# Patient Record
Sex: Male | Born: 1970 | Race: White | Hispanic: No | Marital: Married | Smoking: Never smoker
Health system: Southern US, Community
[De-identification: ages and names within clinical notes are randomized; demographics above are authoritative.]

## PROBLEM LIST (undated history)

## (undated) DIAGNOSIS — F319 Bipolar disorder, unspecified: Secondary | ICD-10-CM

## (undated) DIAGNOSIS — E039 Hypothyroidism, unspecified: Secondary | ICD-10-CM

## (undated) HISTORY — PX: TYMPANOSTOMY TUBE PLACEMENT: SHX32

## (undated) HISTORY — DX: Bipolar disorder, unspecified: F31.9

## (undated) HISTORY — PX: VARICOCELE EXCISION: SUR582

## (undated) HISTORY — PX: APPENDECTOMY: SHX54

## (undated) HISTORY — DX: Hypothyroidism, unspecified: E03.9

## (undated) HISTORY — PX: WISDOM TOOTH EXTRACTION: SHX21

---

## 2001-10-03 HISTORY — PX: KNEE RECONSTRUCTION: SHX5883

## 2001-10-08 ENCOUNTER — Emergency Department (HOSPITAL_COMMUNITY): Admission: EM | Admit: 2001-10-08 | Discharge: 2001-10-09 | Payer: Self-pay | Admitting: Emergency Medicine

## 2001-10-09 ENCOUNTER — Encounter: Payer: Self-pay | Admitting: Emergency Medicine

## 2001-10-09 ENCOUNTER — Encounter: Payer: Self-pay | Admitting: Orthopedic Surgery

## 2006-03-19 ENCOUNTER — Ambulatory Visit (HOSPITAL_COMMUNITY): Admission: RE | Admit: 2006-03-19 | Discharge: 2006-03-19 | Payer: Self-pay | Admitting: Family Medicine

## 2006-03-19 ENCOUNTER — Inpatient Hospital Stay (HOSPITAL_COMMUNITY): Admission: EM | Admit: 2006-03-19 | Discharge: 2006-03-20 | Payer: Self-pay | Admitting: Emergency Medicine

## 2015-09-18 ENCOUNTER — Encounter: Payer: Self-pay | Admitting: Family Medicine

## 2015-09-18 ENCOUNTER — Ambulatory Visit (INDEPENDENT_AMBULATORY_CARE_PROVIDER_SITE_OTHER): Payer: BLUE CROSS/BLUE SHIELD | Admitting: Family Medicine

## 2015-09-18 VITALS — BP 126/77 | HR 49 | Ht 76.0 in | Wt 200.0 lb

## 2015-09-18 DIAGNOSIS — M7711 Lateral epicondylitis, right elbow: Secondary | ICD-10-CM | POA: Diagnosis not present

## 2015-09-18 MED ORDER — NITROGLYCERIN 0.2 MG/HR TD PT24: MEDICATED_PATCH | TRANSDERMAL | Status: DC

## 2015-09-18 NOTE — Patient Instructions (Signed)
You have lateral epicondylitis Ice the area 3-4 times a day for 15 minutes at a time. Tylenol or aleve as needed for pain. Counterforce brace (or sleeve) as directed can help unload area - wear this regularly if it provides you with relief. Hammer rotation exercise, wrist extension exercise with 1 pound weight - 3 sets of 10 once a day.   Stretching - hold for 20-30 seconds and repeat 3 times. Start physical therapy and do home exercises on days you don't go to therapy. Nitro patches 1/4th patch over affected area, change daily. If you're doing well you could in a week start doing the same thing to your left elbow. Consider injection if not improving. Follow up in 6 weeks.

## 2015-09-21 ENCOUNTER — Ambulatory Visit: Payer: Self-pay | Admitting: Family Medicine

## 2015-09-21 DIAGNOSIS — M7711 Lateral epicondylitis, right elbow: Secondary | ICD-10-CM | POA: Insufficient documentation

## 2015-09-21 NOTE — Assessment & Plan Note (Signed)
Shown home exercises to do daily.  Start physical therapy.  Counterforce brace, icing, tylenol/nsaids.  Nitro patches (discussed risks of skin irritation, headache).  Consider injection if not improving.  F/u in 6 weeks for reevaluation.

## 2015-09-21 NOTE — Addendum Note (Signed)
Addended by: Kathi SimpersWISE, Fintan Grater F on: 09/21/2015 01:34 PM   Modules accepted: Orders

## 2015-09-21 NOTE — Progress Notes (Signed)
PCP: No primary care provider on file.  Subjective:   HPI: Patient is a 44 y.o. male here for right elbow pain.  Patient reports about 2 years ago he had similar problems with right elbow - improved after an injection. Then a month ago pain recurred - plays tennis twice a week and worsened with this. Has tried a counterforce brace, rest. Worse picking up items with this hand lateral elbow. Right handed. Pain level 0/10 at rest, up to 7/10 and sharp with wrist motions. No skin changes, fever, other complaints.  No past medical history on file.  No current outpatient prescriptions on file prior to visit.   No current facility-administered medications on file prior to visit.    No past surgical history on file.  No Known Allergies  Social History   Social History  . Marital Status: Married    Spouse Name: N/A  . Number of Children: N/A  . Years of Education: N/A   Occupational History  . Not on file.   Social History Main Topics  . Smoking status: Never Smoker   . Smokeless tobacco: Not on file  . Alcohol Use: Not on file  . Drug Use: Not on file  . Sexual Activity: Not on file   Other Topics Concern  . Not on file   Social History Narrative  . No narrative on file    No family history on file.  BP 126/77 mmHg  Pulse 49  Ht 6\' 4"  (1.93 m)  Wt 200 lb (90.719 kg)  BMI 24.35 kg/m2  Review of Systems: See HPI above.    Objective:  Physical Exam:  Gen: NAD, comfortable in exam room.  Right elbow: No gross deformity, swelling, bruising. TTP lateral epicondyle and just distal to this.  No other tenderness. FROM elbow without pain.  Pain on resisted wrist and 3rd digit extension. Collateral ligaments intact. NVI distally.  Left elbow: FROM without pain.    Assessment & Plan:  1. Right lateral epicondylitis -  Shown home exercises to do daily.  Start physical therapy.  Counterforce brace, icing, tylenol/nsaids.  Nitro patches (discussed risks of skin  irritation, headache).  Consider injection if not improving.  F/u in 6 weeks for reevaluation.

## 2015-09-22 ENCOUNTER — Telehealth: Payer: Self-pay | Admitting: Family Medicine

## 2015-10-07 ENCOUNTER — Telehealth: Payer: Self-pay | Admitting: Family Medicine

## 2015-10-07 NOTE — Telephone Encounter (Signed)
Typically we need more information from physical therapy depending on how they want to do this and how frequently.  I would need to know the concentration and amount of dexamethasone to make sure I send in enough.

## 2015-10-30 ENCOUNTER — Encounter: Payer: Self-pay | Admitting: Family Medicine

## 2015-10-30 ENCOUNTER — Ambulatory Visit (INDEPENDENT_AMBULATORY_CARE_PROVIDER_SITE_OTHER): Payer: BLUE CROSS/BLUE SHIELD | Admitting: Family Medicine

## 2015-10-30 VITALS — BP 123/72 | HR 86 | Ht 76.0 in | Wt 200.0 lb

## 2015-10-30 DIAGNOSIS — M7711 Lateral epicondylitis, right elbow: Secondary | ICD-10-CM

## 2015-10-30 DIAGNOSIS — M25522 Pain in left elbow: Secondary | ICD-10-CM | POA: Diagnosis not present

## 2015-10-30 NOTE — Patient Instructions (Signed)
Consider using patch 12 hours on right, 12 hours on left. We will add physical therapy for left side as well. Call me if you want to try an injection. Follow up with me in 6 weeks otherwise.

## 2015-11-03 NOTE — Progress Notes (Signed)
PCP: No primary care provider on file.  Subjective:   HPI: Patient is a 45 y.o. male here for right elbow pain.  09/18/15: Patient reports about 2 years ago he had similar problems with right elbow - improved after an injection. Then a month ago pain recurred - plays tennis twice a week and worsened with this. Has tried a counterforce brace, rest. Worse picking up items with this hand lateral elbow. Right handed. Pain level 0/10 at rest, up to 7/10 and sharp with wrist motions. No skin changes, fever, other complaints.  10/30/15: Patient reports he has had some progress since last visit. Pain level 0/10 at rest, up to 5/10 with movement of wrist. Pain is sharp. Nitro caused small headaches but tolerating. Doing physical therapy twice a week. No skin changes, fever, other complaints. Some pain left elbow now in same area.  No past medical history on file.  Current Outpatient Prescriptions on File Prior to Visit  Medication Sig Dispense Refill  . lamoTRIgine (LAMICTAL) 200 MG tablet TK 1 T PO QD  5  . nitroGLYCERIN (NITRODUR - DOSED IN MG/24 HR) 0.2 mg/hr patch Apply 1/4th patch to affected elbow, change daily 30 patch 1   No current facility-administered medications on file prior to visit.    No past surgical history on file.  No Known Allergies  Social History   Social History  . Marital Status: Married    Spouse Name: N/A  . Number of Children: N/A  . Years of Education: N/A   Occupational History  . Not on file.   Social History Main Topics  . Smoking status: Never Smoker   . Smokeless tobacco: Not on file  . Alcohol Use: Not on file  . Drug Use: Not on file  . Sexual Activity: Not on file   Other Topics Concern  . Not on file   Social History Narrative    No family history on file.  BP 123/72 mmHg  Pulse 86  Ht  (1.93 m)  Wt 200 lb (90.719 kg)  BMI 24.35 kg/m2  Review of Systems: See HPI above.    Objective:  Physical Exam:  Gen:  NAD, comfortable in exam room.  Right elbow: No gross deformity, swelling, bruising. TTP lateral epicondyle and just distal to this.  No other tenderness. FROM elbow without pain.  Pain on resisted wrist and 3rd digit extension but improved. Collateral ligaments intact. NVI distally.  Left elbow: FROM. Mild TTP lateral epicondyle.  No pain resisted wrist extension, 3rd digit extension.    Assessment & Plan:  1. Right lateral epicondylitis -  Continue physical therapy, home exercises.   Some slow progress since last visit.  Some pain on left now though too - alternate use of patch on each side.  Consider injection if still not improving.  F/u in 6 weeks.

## 2015-11-03 NOTE — Assessment & Plan Note (Signed)
Continue physical therapy, home exercises.   Some slow progress since last visit.  Some pain on left now though too - alternate use of patch on each side.  Consider injection if still not improving.  F/u in 6 weeks.

## 2015-11-12 NOTE — Telephone Encounter (Signed)
Finished

## 2018-05-24 ENCOUNTER — Other Ambulatory Visit: Payer: Self-pay | Admitting: Orthopedic Surgery

## 2018-05-24 DIAGNOSIS — M25511 Pain in right shoulder: Secondary | ICD-10-CM

## 2018-06-07 ENCOUNTER — Other Ambulatory Visit: Payer: BLUE CROSS/BLUE SHIELD

## 2018-07-13 ENCOUNTER — Encounter: Payer: Self-pay | Admitting: Emergency Medicine

## 2018-07-26 ENCOUNTER — Ambulatory Visit: Payer: Self-pay | Admitting: Psychiatry

## 2018-08-21 ENCOUNTER — Ambulatory Visit: Payer: Self-pay | Admitting: Psychiatry

## 2018-09-11 ENCOUNTER — Encounter: Payer: Self-pay | Admitting: Psychiatry

## 2018-09-11 ENCOUNTER — Ambulatory Visit (INDEPENDENT_AMBULATORY_CARE_PROVIDER_SITE_OTHER): Payer: BLUE CROSS/BLUE SHIELD | Admitting: Psychiatry

## 2018-09-11 DIAGNOSIS — Z79899 Other long term (current) drug therapy: Secondary | ICD-10-CM | POA: Diagnosis not present

## 2018-09-11 DIAGNOSIS — F319 Bipolar disorder, unspecified: Secondary | ICD-10-CM

## 2018-09-11 NOTE — Progress Notes (Signed)
Julian [name] [id number] 01-27-1971 47 y.o.  Subjective:   Patient ID:  Julian [name] is a 47 y.o. (DOB 01-27-1971) male.  Chief Complaint:  Chief Complaint  Patient presents with  . Follow-up    Mdication Managment    HPI [name] presents to the office today for follow-up of adding lithium.  Immediate profound positive effects and then seemed to lose it.  Wonders if dehydration may have played a role.  Now in a more stable positive place today. More even keel and stable than ever.  Can get a little blue but nothing unusual.  High side is capped..  Slight improvement cognitively.  Wonders about cutting the lamotrigine bc benefit from the lithium.   Review of Systems:  Review of Systems  Neurological: Negative for tremors and weakness.  Psychiatric/Behavioral: Negative for agitation, behavioral problems, confusion, decreased concentration, dysphoric mood, hallucinations, self-injury, sleep disturbance and suicidal ideas. The patient is not nervous/anxious and is not hyperactive.     Medications: I have reviewed the patient's current medications.  Current Outpatient Medications  Medication Sig Dispense Refill  . lamoTRIgine (LAMICTAL) 200 MG tablet Take 200 mg by mouth daily.   5  . lithium carbonate 300 MG capsule Take 600 mg by mouth daily.    . traZODone (DESYREL) 50 MG tablet Take 50 mg by mouth at bedtime.    [name]. zolpidem (AMBIEN) 10 MG tablet Take 10 mg by mouth at bedtime as needed for sleep. 1/2 QHS PRN insomnia    . nitroGLYCERIN (NITRODUR - DOSED IN MG/24 HR) 0.2 mg/hr patch Apply 1/4th patch to affected elbow, change daily (Patient not taking: Reported on 09/11/2018) 30 patch 1  . polyethylene glycol-electrolytes (NULYTELY/GOLYTELY) 420 g solution TK UTD  0   No current facility-administered medications for this visit.     Medication Side Effects: Other: slight dizziness  Allergies: No Known Allergies  History reviewed. No pertinent past medical history.  History  reviewed. No pertinent family history.  Social History   Socioeconomic History  . Marital status: Married    Spouse name: Not on file  . Number of children: Not on file  . Years of education: Not on file  . Highest education level: Not on file  Occupational History  . Not on file  Social Needs  . Financial resource strain: Not on file  . Food insecurity:    Worry: Not on file    Inability: Not on file  . Transportation needs:    Medical: Not on file    Non-medical: Not on file  Tobacco Use  . Smoking status: Never Smoker  . Smokeless tobacco: Never Used  Substance and Sexual Activity  . Alcohol use: Not on file  . Drug use: Not on file  . Sexual activity: Not on file  Lifestyle  . Physical activity:    Days per week: Not on file    Minutes per session: Not on file  . Stress: Not on file  Relationships  . Social connections:    Talks on phone: Not on file    Gets together: Not on file    Attends religious service: Not on file    Active member of club or organization: Not on file    Attends meetings of clubs or organizations: Not on file    Relationship status: Not on file  . Intimate partner violence:    Fear of current or ex partner: Not on file    Emotionally abused: Not on file  Physically abused: Not on file    Forced sexual activity: Not on file  Other Topics Concern  . Not on file  Social History Narrative  . Not on file    Past Medical History, Surgical history, Social history, and Family history were reviewed and updated as appropriate.   Please see review of systems for further details on the patient's review from today.   Objective:   Physical Exam:  There were no vitals taken for this visit.  Physical Exam  Constitutional: He is oriented to person, place, and time. He appears well-developed. No distress.  Musculoskeletal: He exhibits no deformity.  Neurological: He is alert and oriented to person, place, and time. He displays no tremor.  Coordination and gait normal.  Psychiatric: He has a normal mood and affect. His speech is normal and behavior is normal. Judgment and thought content normal. His mood appears not anxious. His affect is not angry, not blunt, not labile and not inappropriate. Cognition and memory are normal. He does not exhibit a depressed mood. He expresses no homicidal and no suicidal ideation. He expresses no suicidal plans and no homicidal plans.  Insight intact. No auditory or visual hallucinations. No delusions.  He is attentive.    Lab Review:  No results found for: NA, K, CL, CO2, GLUCOSE, BUN, CREATININE, CALCIUM, PROT, ALBUMIN, AST, ALT, ALKPHOS, BILITOT, GFRNONAA, GFRAA  No results found for: WBC, RBC, HGB, HCT, PLT, MCV, MCH, MCHC, RDW, LYMPHSABS, MONOABS, EOSABS, BASOSABS  No results found for: POCLITH, LITHIUM   No results found for: PHENYTOIN, PHENOBARB, VALPROATE, CBMZ   .res Assessment: Plan:    Bipolar I disorder (HCC) - Plan: Lithium level  Lithium use - Plan: Lithium level, Basic metabolic panel, TSH   FU 10 weeks and if doing well we will reduce the lamotrigine to 150 mg a day.  He wants to discover whether he can be a lithium mono responder.  He is aware there is risks and reducing the lamotrigine but wonders if lamotrigine is causing some mild cognitive dysfunction.  He is also aware that he is likely had a low-dose of lithium and will have a likely low blood level but agrees to checking labs to verify this information.  Because of the brief period of time in which he seemed to have more side effects from lithium around heavy exertion we will not use the dosage of lithium at this time.  Counseled patient regarding potential benefits, risks, and side effects of lithium to include potential risk of lithium affecting thyroid and renal function.  Discussed need for periodic lab monitoring to determine drug level and to assess for potential adverse effects.  Counseled patient regarding  signs and symptoms of lithium toxicity and advised that they notify office immediately or seek urgent medical attention if experiencing these signs and symptoms.  Patient advised to contact office with any questions or concerns.  Monitor for any sign of rash. Please stop taking Lamictal and contact office immediately rash develops. Recommend seeking urgent medical attention if rash is severe and/or spreading quickly.  He has questions around the value of the other supplements.  I recommended that he not discontinue anything except potentially the fish oil at this point because we want to maximize this because it cognitive abilities.  Meredith Staggers, MD, DFAPA  Please see After Visit Summary for patient specific instructions.  Future Appointments  Date Time Provider Department Center  11/20/2018  9:00 AM Cottle, Steva Ready., MD [facility] None  Orders Placed This Encounter  Procedures  . Lithium level  . Basic metabolic panel  . TSH      -------------------------------

## 2018-09-20 ENCOUNTER — Other Ambulatory Visit: Payer: Self-pay

## 2018-09-20 MED ORDER — LITHIUM CARBONATE 300 MG PO CAPS: 600.0000 mg | ORAL_CAPSULE | Freq: Every day | ORAL | 1 refills | Status: DC

## 2018-09-21 ENCOUNTER — Telehealth: Payer: Self-pay

## 2018-09-21 LAB — BASIC METABOLIC PANEL
BUN: 20 mg/dL (ref 7–25)
CO2: 29 mmol/L (ref 20–32)
Calcium: 10 mg/dL (ref 8.6–10.3)
Chloride: 104 mmol/L (ref 98–110)
Creat: 1.29 mg/dL (ref 0.60–1.35)
Glucose, Bld: 107 mg/dL — ABNORMAL HIGH (ref 65–99)
Potassium: 5 mmol/L (ref 3.5–5.3)
Sodium: 141 mmol/L (ref 135–146)

## 2018-09-21 LAB — TSH: TSH: 3.83 mIU/L (ref 0.40–4.50)

## 2018-09-21 LAB — LITHIUM LEVEL: Lithium Lvl: 0.4 mmol/L — ABNORMAL LOW (ref 0.6–1.2)

## 2018-09-21 NOTE — Telephone Encounter (Signed)
-----   Message from Lauraine Rinnearey G Cottle Jr., MD sent at 09/21/2018  4:05 PM EST ----- On lithium 600 mg at night and is tolerating it well.  His lithium level is low normal as expected.  He is electrolytes and kidney function test and thyroid test are normal.  There is no reason to change any medication based on these tests.  These inform him.

## 2018-11-09 ENCOUNTER — Other Ambulatory Visit: Payer: Self-pay

## 2018-11-09 MED ORDER — LAMOTRIGINE 200 MG PO TABS: 200.0000 mg | ORAL_TABLET | Freq: Every day | ORAL | 0 refills | Status: DC

## 2018-11-20 ENCOUNTER — Ambulatory Visit (INDEPENDENT_AMBULATORY_CARE_PROVIDER_SITE_OTHER): Payer: BLUE CROSS/BLUE SHIELD | Admitting: Psychiatry

## 2018-11-20 ENCOUNTER — Encounter: Payer: Self-pay | Admitting: Psychiatry

## 2018-11-20 DIAGNOSIS — Z79899 Other long term (current) drug therapy: Secondary | ICD-10-CM | POA: Diagnosis not present

## 2018-11-20 DIAGNOSIS — F319 Bipolar disorder, unspecified: Secondary | ICD-10-CM

## 2018-11-20 MED ORDER — LAMOTRIGINE 150 MG PO TABS: 150.0000 mg | ORAL_TABLET | Freq: Every day | ORAL | 0 refills | Status: DC

## 2018-11-20 NOTE — Progress Notes (Signed)
Julian [name] [id number] 1971-07-02 48 y.o.  Subjective:   Patient ID:  Julian [name] is a 48 y.o. (DOB [birth year]) male.  Chief Complaint:  Chief Complaint  Patient presents with  . Follow-up    Medication management   Last seen December 10 HPI Zakhai Klebe presents to the office today for follow-up of adding lithium.   At last visit we considered but did not reduced the lamotrigine to 150 with the goal of determining if he can be a lithium mono responder  Still believes the lithium is a wonder drug.  Never been more stable.  Patient reports stable mood and denies depressed or irritable moods.  Patient denies any recent difficulty with anxiety.  Positive stress of new job and gets up early to go work out.  Light sleeper.  Patient denies difficulty with sleep initiation or maintenance, but works hard to get enough sleep.  Denies appetite disturbance.  Patient reports that energy and motivation have been good.  Patient denies any difficulty with concentration.  Patient denies any suicidal ideation. Lately working hard and more tired the last few days.  Immediate profound positive effects and then seemed to lose it.  Wonders if dehydration may have played a role.  Now in a more stable positive place today. More even keel and stable than ever.  Can get a little blue but nothing unusual.  High side is capped..  Slight improvement cognitively.  Wonders about cutting the lamotrigine bc benefit from the lithium.   Review of Systems:  Review of Systems  Neurological: Negative for tremors and weakness.  Psychiatric/Behavioral: Negative for agitation, behavioral problems, confusion, decreased concentration, dysphoric mood, hallucinations, self-injury, sleep disturbance and suicidal ideas. The patient is not nervous/anxious and is not hyperactive.     Medications: I have reviewed the patient's current medications.  Current Outpatient Medications  Medication Sig Dispense Refill  . Acetylcysteine 600  MG CAPS Take 600 mg by mouth daily.    [name] lamoTRIgine (LAMICTAL) 200 MG tablet Take 1 tablet (200 mg total) by mouth daily. 90 tablet 0  . lithium carbonate 300 MG capsule Take 2 capsules (600 mg total) by mouth daily. 180 capsule 1  . traZODone (DESYREL) 50 MG tablet Take 50 mg by mouth at bedtime.    [name] zolpidem (AMBIEN) 10 MG tablet Take 10 mg by mouth at bedtime as needed for sleep. 1/2 QHS PRN insomnia    . lamoTRIgine (LAMICTAL) 150 MG tablet Take 1 tablet (150 mg total) by mouth daily. 90 tablet 0  . nitroGLYCERIN (NITRODUR - DOSED IN MG/24 HR) 0.2 mg/hr patch Apply 1/4th patch to affected elbow, change daily (Patient not taking: Reported on 09/11/2018) 30 patch 1  . polyethylene glycol-electrolytes (NULYTELY/GOLYTELY) 420 g solution TK UTD  0   No current facility-administered medications for this visit.     Medication Side Effects: dry mouth.  Allergies: No Known Allergies  History reviewed. No pertinent past medical history.  History reviewed. No pertinent family history.  Social History   Socioeconomic History  . Marital status: Married    Spouse name: Not on file  . Number of children: Not on file  . Years of education: Not on file  . Highest education level: Not on file  Occupational History  . Not on file  Social Needs  . Financial resource strain: Not on file  . Food insecurity:    Worry: Not on file    Inability: Not on file  . Transportation needs:    Medical:  Not on file    Non-medical: Not on file  Tobacco Use  . Smoking status: Never Smoker  . Smokeless tobacco: Never Used  Substance and Sexual Activity  . Alcohol use: Not on file  . Drug use: Not on file  . Sexual activity: Not on file  Lifestyle  . Physical activity:    Days per week: Not on file    Minutes per session: Not on file  . Stress: Not on file  Relationships  . Social connections:    Talks on phone: Not on file    Gets together: Not on file    Attends religious service: Not on file     Active member of club or organization: Not on file    Attends meetings of clubs or organizations: Not on file    Relationship status: Not on file  . Intimate partner violence:    Fear of current or ex partner: Not on file    Emotionally abused: Not on file    Physically abused: Not on file    Forced sexual activity: Not on file  Other Topics Concern  . Not on file  Social History Narrative  . Not on file    Past Medical History, Surgical history, Social history, and Family history were reviewed and updated as appropriate.   Please see review of systems for further details on the patient's review from today.   Objective:   Physical Exam:  There were no vitals taken for this visit.  Physical Exam Constitutional:      General: He is not in acute distress.    Appearance: He is well-developed.  Musculoskeletal:        General: No deformity.  Neurological:     Mental Status: He is alert and oriented to person, place, and time.     Motor: No tremor.     Coordination: Coordination normal.     Gait: Gait normal.  Psychiatric:        Attention and Perception: He is attentive.        Mood and Affect: Mood is not anxious or depressed. Affect is not labile, blunt, angry or inappropriate.        Speech: Speech normal.        Behavior: Behavior normal.        Thought Content: Thought content normal. Thought content does not include homicidal or suicidal ideation. Thought content does not include homicidal or suicidal plan.        Judgment: Judgment normal.     Comments: Insight intact. No auditory or visual hallucinations. No delusions.      Lab Review:     Component Value Date/Time   NA 141 09/20/2018 0825   K 5.0 09/20/2018 0825   CL 104 09/20/2018 0825   CO2 29 09/20/2018 0825   GLUCOSE 107 (H) 09/20/2018 0825   BUN 20 09/20/2018 0825   CREATININE 1.29 09/20/2018 0825   CALCIUM 10.0 09/20/2018 0825    No results found for: WBC, RBC, HGB, HCT, PLT, MCV, MCH, MCHC,  RDW, LYMPHSABS, MONOABS, EOSABS, BASOSABS  Lithium Lvl  Date Value Ref Range Status  09/20/2018 0.4 (L) 0.6 - 1.2 mmol/L Final     No results found for: PHENYTOIN, PHENOBARB, VALPROATE, CBMZ   .res Assessment: Plan:    Bipolar I disorder (HCC)  Lithium use   Disc the low level of lithium and possible need to increase the dosage.  Disc the dosage range.   well we will reduce the  lamotrigine to 150 mg a day.  He wants to discover whether he can be a lithium mono responder.  He also wants to see if he feels a little cognitively sharper.He is aware there is risks and reducing the lamotrigine but wonders if lamotrigine is causing some mild cognitive dysfunction.  Reduce lamotrigine 150 mg daily for 2 months In mid April if you are feeling well call and we will will reduce again to 100 mg daily  Counseled patient regarding potential benefits, risks, and side effects of lithium to include potential risk of lithium affecting thyroid and renal function.  Discussed need for periodic lab monitoring to determine drug level and to assess for potential adverse effects.  Counseled patient regarding signs and symptoms of lithium toxicity and advised that they notify office immediately or seek urgent medical attention if experiencing these signs and symptoms.  Patient advised to contact office with any questions or concerns.  Monitor for any sign of rash. Please stop taking Lamictal and contact office immediately rash develops. Recommend seeking urgent medical attention if rash is severe and/or spreading quickly.  He has questions around the value of the other supplements.  I recommended that he not discontinue anything except potentially the fish oil at this point because we want to maximize this because it cognitive abilities. And continue the NAC.  Call if there is any worsening of depression or increased mood instability.  We may have to increase the lithium because of a low blood level of 0.4 he is  aware that it is below the usual therapeutic dosage range.  FU 4 mos  Meredith Staggers, MD, DFAPA  Please see After Visit Summary for patient specific instructions.  No future appointments.  No orders of the defined types were placed in this encounter.     -------------------------------

## 2018-11-20 NOTE — Patient Instructions (Signed)
Reduce lamotrigine 150 mg daily for 2 months In mid April if you are feeling well call and we will will reduce again to 100 mg daily

## 2018-11-29 ENCOUNTER — Other Ambulatory Visit: Payer: Self-pay | Admitting: Psychiatry

## 2019-02-13 ENCOUNTER — Other Ambulatory Visit: Payer: Self-pay | Admitting: Psychiatry

## 2019-03-13 ENCOUNTER — Other Ambulatory Visit: Payer: Self-pay

## 2019-03-13 ENCOUNTER — Encounter: Payer: Self-pay | Admitting: Psychiatry

## 2019-03-13 ENCOUNTER — Ambulatory Visit (INDEPENDENT_AMBULATORY_CARE_PROVIDER_SITE_OTHER): Payer: BC Managed Care – PPO | Admitting: Psychiatry

## 2019-03-13 DIAGNOSIS — F319 Bipolar disorder, unspecified: Secondary | ICD-10-CM

## 2019-03-13 DIAGNOSIS — Z79899 Other long term (current) drug therapy: Secondary | ICD-10-CM

## 2019-03-13 MED ORDER — LITHIUM CARBONATE 150 MG PO CAPS: 150.0000 mg | ORAL_CAPSULE | Freq: Three times a day (TID) | ORAL | 0 refills | Status: DC

## 2019-03-13 NOTE — Progress Notes (Signed)
Julian [name] [id number] 01/30/1971 48 y.o.  Subjective:   Patient ID:  Julian [name] is a 48 y.o. (DOB April 10, 1971) male.  Chief Complaint:  Chief Complaint  Patient presents with  . Follow-up    Medication Management    HPI Julian [name] presents to the office today for follow-up of adding lithium and reducing lamotrigine from 200 mg to 150 mg daily..   Last visit in February and lamotrigine was decreased with the goal of determining if Julian [name] can be a lithium mono responder  Has reduced lamotrigine to 150 mg without problems now.  Mood is stable.  Better cognition with reduction.  Transition was hard with reduction.  Felt sort of weak and uncomfortable.  Julian [name] feels it whenever meds are changed and has SE initially.  Worries about increasing the lithium as we discussed last time.  Still believes the lithium is a wonder drug.  Very stable.  Patient reports stable mood and denies depressed or irritable moods.  Patient denies any recent difficulty with anxiety.  Positive stress of new job and gets up early to go work out.  Light sleeper.  Patient denies difficulty with sleep initiation or maintenance, but works hard to get enough sleep.  Denies appetite disturbance.  Patient reports that energy and motivation have been good.  Patient denies any difficulty with concentration.  Patient denies any suicidal ideation. Lately working hard and more tired the last few days.  Exercise helps. Works from home.  Past Psychiatric Medication Trials: Lithium, fluvoxamine, risperidone, lamotrigine, Ambien, trazodone  2 prior significant manic episodes .Review of Systems:  Review of Systems  Neurological: Negative for tremors and weakness.  Psychiatric/Behavioral: Negative for agitation, behavioral problems, confusion, decreased concentration, dysphoric mood, hallucinations, self-injury, sleep disturbance and suicidal ideas. The patient is not nervous/anxious and is not hyperactive.     Medications: I have  reviewed the patient's current medications.  Current Outpatient Medications  Medication Sig Dispense Refill  . Acetylcysteine 600 MG CAPS Take 600 mg by mouth daily.    [name] lamoTRIgine (LAMICTAL) 150 MG tablet TAKE 1 TABLET(150 MG) BY MOUTH DAILY 90 tablet 0  . lithium carbonate 300 MG capsule Take 2 capsules (600 mg total) by mouth daily. 180 capsule 1  . traZODone (DESYREL) 50 MG tablet TAKE 1 TABLET BY MOUTH EVERY NIGHT AT BEDTIME AS NEEDED FOR INSOMNIA 30 tablet 5  . zolpidem (AMBIEN) 10 MG tablet Take 10 mg by mouth at bedtime as needed for sleep. 1/2 QHS PRN insomnia    . lithium carbonate 150 MG capsule Take 1 capsule (150 mg total) by mouth 3 (three) times daily with meals. 90 capsule 0   No current facility-administered medications for this visit.     Medication Side Effects: dry mouth.  Allergies: No Known Allergies  History reviewed. No pertinent past medical history.  History reviewed. No pertinent family history.  Social History   Socioeconomic History  . Marital status: Married    Spouse name: Not on file  . Number of children: Not on file  . Years of education: Not on file  . Highest education level: Not on file  Occupational History  . Not on file  Social Needs  . Financial resource strain: Not on file  . Food insecurity:    Worry: Not on file    Inability: Not on file  . Transportation needs:    Medical: Not on file    Non-medical: Not on file  Tobacco Use  . Smoking status: Never Smoker  .  Smokeless tobacco: Never Used  Substance and Sexual Activity  . Alcohol use: Not on file  . Drug use: Not on file  . Sexual activity: Not on file  Lifestyle  . Physical activity:    Days per week: Not on file    Minutes per session: Not on file  . Stress: Not on file  Relationships  . Social connections:    Talks on phone: Not on file    Gets together: Not on file    Attends religious service: Not on file    Active member of club or organization: Not on file     Attends meetings of clubs or organizations: Not on file    Relationship status: Not on file  . Intimate partner violence:    Fear of current or ex partner: Not on file    Emotionally abused: Not on file    Physically abused: Not on file    Forced sexual activity: Not on file  Other Topics Concern  . Not on file  Social History Narrative  . Not on file    Past Medical History, Surgical history, Social history, and Family history were reviewed and updated as appropriate.   Please see review of systems for further details on the patient's review from today.   Objective:   Physical Exam:  There were no vitals taken for this visit.  Physical Exam Constitutional:      General: Julian [name] is not in acute distress.    Appearance: Julian [name] is well-developed.  Musculoskeletal:        General: No deformity.  Neurological:     Mental Status: Julian [name] is alert and oriented to person, place, and time.     Motor: No tremor.     Coordination: Coordination normal.     Gait: Gait normal.  Psychiatric:        Attention and Perception: Julian [name] is attentive.        Mood and Affect: Mood is not anxious or depressed. Affect is not labile, blunt, angry or inappropriate.        Speech: Speech normal.        Behavior: Behavior normal.        Thought Content: Thought content normal. Thought content does not include homicidal or suicidal ideation. Thought content does not include homicidal or suicidal plan.        Judgment: Judgment normal.     Comments: Insight intact. No auditory or visual hallucinations. No delusions.      Lab Review:     Component Value Date/Time   NA 141 09/20/2018 0825   K 5.0 09/20/2018 0825   CL 104 09/20/2018 0825   CO2 29 09/20/2018 0825   GLUCOSE 107 (H) 09/20/2018 0825   BUN 20 09/20/2018 0825   CREATININE 1.29 09/20/2018 0825   CALCIUM 10.0 09/20/2018 0825    No results found for: WBC, RBC, HGB, HCT, PLT, MCV, MCH, MCHC, RDW, LYMPHSABS, MONOABS, EOSABS, BASOSABS  Lithium Lvl   Date Value Ref Range Status  09/20/2018 0.4 (L) 0.6 - 1.2 mmol/L Final     No results found for: PHENYTOIN, PHENOBARB, VALPROATE, CBMZ   .res Assessment: Plan:    Bipolar I disorder (HCC) - Plan: lithium carbonate 150 MG capsule, Lithium level, Basic metabolic panel  Lithium use - Plan: Basic metabolic panel   Greater than 40%50% of face to face time with patient was spent on counseling and coordination of care. We discussed the following: Doing well.  Julian [name] is somewhat fearful  about medicine changes because Julian [name] seems to have adverse reactions initially after any medication change but then it resolves within a couple of weeks.  However at the same time Julian [name] does want to try to get off of lamotrigine and see if Julian [name] can be maintained on lithium alone.  The odds are good that that will be successful if it is done gradually.  Disc the low level of lithium and possible need to increase the dosage.  Disc the dosage range.   Julian [name] agrees.  Due to low lithium level rec increase into the normal range by increasing lithium to 750 mg daily .  Julian [name]'s hesitant to go straight to 900 mg daily which is what is recommended.  Julian [name] wants to discover whether Julian [name] can be a lithium mono responder.  Julian [name] also wants to see if Julian [name] feels a little cognitively sharper  .Julian [name] is aware there is risks and reducing the lamotrigine but wonders if lamotrigine is causing some mild cognitive dysfunction. Has seen cognitive benefit with reduction from 200 to 150 mg daily.  After established on higher lithium then, Reduce lamotrigine, for 2 weeks take 150 mg alternating with 100 mg every other day. Then reduce to 100 mg daily for 2 weeks. Then reduce to 50 mg for 2 weeks. Then stop lamotrigine.  Counseled patient regarding potential benefits, risks, and side effects of lithium to include potential risk of lithium affecting thyroid and renal function.  Discussed need for periodic lab monitoring to determine drug level and to assess for potential  adverse effects.  Counseled patient regarding signs and symptoms of lithium toxicity and advised that they notify office immediately or seek urgent medical attention if experiencing these signs and symptoms.  Patient advised to contact office with any questions or concerns.  Monitor for any sign of rash. Please stop taking Lamictal and contact office immediately rash develops. Recommend seeking urgent medical attention if rash is severe and/or spreading quickly.  Julian [name] has questions around the value of the other supplements.  I recommended that Julian [name] not discontinue anything except potentially the fish oil at this point because we want to maximize this because it cognitive abilities. And continue the NAC.  This appt was 30 mins.  FU 2 mos  Meredith Staggersarey Cottle, MD, DFAPA  Please see After Visit Summary for patient specific instructions.  Future Appointments  Date Time Provider Department Center  05/16/2019  9:30 AM Cottle, Steva Readyarey G Jr., MD [facility] None    Orders Placed This Encounter  Procedures  . Lithium level  . Basic metabolic panel      -------------------------------

## 2019-03-13 NOTE — Patient Instructions (Signed)
Reduce lamotrigine, for 2 weeks take 150 mg alternating with 100 mg every other day. Then reduce to 100 mg daily for 2 weeks. Then reduce to 50 mg for 2 weeks. Then stop lamotrigine.

## 2019-03-20 ENCOUNTER — Other Ambulatory Visit: Payer: Self-pay | Admitting: Psychiatry

## 2019-05-09 LAB — BASIC METABOLIC PANEL
BUN: 18 mg/dL (ref 7–25)
CO2: 30 mmol/L (ref 20–32)
Calcium: 9.3 mg/dL (ref 8.6–10.3)
Chloride: 103 mmol/L (ref 98–110)
Creat: 1.29 mg/dL (ref 0.60–1.35)
Glucose, Bld: 113 mg/dL (ref 65–139)
Potassium: 4.7 mmol/L (ref 3.5–5.3)
Sodium: 138 mmol/L (ref 135–146)

## 2019-05-09 LAB — LITHIUM LEVEL: Lithium Lvl: 0.5 mmol/L — ABNORMAL LOW (ref 0.6–1.2)

## 2019-05-14 ENCOUNTER — Other Ambulatory Visit: Payer: Self-pay

## 2019-05-14 MED ORDER — ZOLPIDEM TARTRATE 10 MG PO TABS: ORAL_TABLET | ORAL | 0 refills | Status: DC

## 2019-05-16 ENCOUNTER — Encounter: Payer: Self-pay | Admitting: Psychiatry

## 2019-05-16 ENCOUNTER — Ambulatory Visit (INDEPENDENT_AMBULATORY_CARE_PROVIDER_SITE_OTHER): Payer: BC Managed Care – PPO | Admitting: Psychiatry

## 2019-05-16 ENCOUNTER — Other Ambulatory Visit: Payer: Self-pay

## 2019-05-16 DIAGNOSIS — Z79899 Other long term (current) drug therapy: Secondary | ICD-10-CM | POA: Diagnosis not present

## 2019-05-16 DIAGNOSIS — F319 Bipolar disorder, unspecified: Secondary | ICD-10-CM

## 2019-05-16 MED ORDER — LITHIUM CARBONATE 300 MG PO CAPS: 900.0000 mg | ORAL_CAPSULE | Freq: Every day | ORAL | 0 refills | Status: DC

## 2019-05-16 NOTE — Progress Notes (Signed)
Julian BonReaves Conover [id number] 12-Nov-1970 48 y.o.  Subjective:   Patient ID:  Julian [name] is a 48 y.o. (DOB 12-Nov-1970) male.  Chief Complaint:  Chief Complaint  Patient presents with  . Follow-up    Psychiatric med management  . Follow-up med changes    For mood disorder    HPI Julian [name] presents to the office today for follow-up of adding lithium and reducing lamotrigine.  At the last visit in June we increased lithium to 750 mg daily because of a low lithium level and started tapering lamotrigine in hopes that he could be a lithium mono responder.  He was also scheduled to get a lithium level.   Tapering lamotrigine down to 50 mg daily.  I like where I am today.  More confidence and more myself.  Has noted each drop in lamotrigine.  Not comfortable.  Vague but feels a little anxiety, not depression.  The feeling was significant over a week and half and better the last few days.  Mood is stable.  Better cognition with reduction.  Transition was hard with reduction.  Felt sort of weak and uncomfortable but manageable.  Better cognition with less also of the lamotrigine.  He feels it whenever meds are changed and has SE initially.  Worries about increasing the lithium as we discussed last time.  Still believes the lithium is a wonder drug.  Very stable.  Patient reports stable mood and denies depressed or irritable moods.  Patient denies any recent difficulty with anxiety.  Positive stress of new job and gets up early to go work out.  Light sleeper.  Patient denies difficulty with sleep initiation or maintenance, but works hard to get enough sleep.  Usually taking Ambien lately and not trazodone.  Sleep fine with it.  Denies appetite disturbance.  Patient reports that energy and motivation have been good.  Patient denies any difficulty with concentration.  Patient denies any suicidal ideation. Lately working hard and more tired the last few days.  Exercise helps. Works from home.  Past  Psychiatric Medication Trials: Lithium, fluvoxamine, risperidone, lamotrigine, Ambien, trazodone  2 prior significant manic episodes .Review of Systems:  Review of Systems  Neurological: Negative for tremors and weakness.  Psychiatric/Behavioral: Negative for agitation, behavioral problems, confusion, decreased concentration, dysphoric mood, hallucinations, self-injury, sleep disturbance and suicidal ideas. The patient is not nervous/anxious and is not hyperactive.     Medications: I have reviewed the patient's current medications.  Current Outpatient Medications  Medication Sig Dispense Refill  . Acetylcysteine 600 MG CAPS Take 600 mg by mouth daily.    [name]. lithium carbonate 300 MG capsule TAKE 2 CAPSULES BY MOUTH DAILY 180 capsule 1  . traZODone (DESYREL) 50 MG tablet TAKE 1 TABLET BY MOUTH EVERY NIGHT AT BEDTIME AS NEEDED FOR INSOMNIA 30 tablet 5  . zolpidem (AMBIEN) 10 MG tablet Take 1/2-1 tablet QHS PRN insomnia 30 tablet 0   No current facility-administered medications for this visit.     Medication Side Effects: dry mouth.  No tremor nor diarrhea.    Allergies: No Known Allergies  History reviewed. No pertinent past medical history.  History reviewed. No pertinent family history.  Social History   Socioeconomic History  . Marital status: Married    Spouse name: Not on file  . Number of children: Not on file  . Years of education: Not on file  . Highest education level: Not on file  Occupational History  . Not on file  Social Needs  .  Financial resource strain: Not on file  . Food insecurity    Worry: Not on file    Inability: Not on file  . Transportation needs    Medical: Not on file    Non-medical: Not on file  Tobacco Use  . Smoking status: Never Smoker  . Smokeless tobacco: Never Used  Substance and Sexual Activity  . Alcohol use: Not on file  . Drug use: Not on file  . Sexual activity: Not on file  Lifestyle  . Physical activity    Days per week: Not  on file    Minutes per session: Not on file  . Stress: Not on file  Relationships  . Social Musicianconnections    Talks on phone: Not on file    Gets together: Not on file    Attends religious service: Not on file    Active member of club or organization: Not on file    Attends meetings of clubs or organizations: Not on file    Relationship status: Not on file  . Intimate partner violence    Fear of current or ex partner: Not on file    Emotionally abused: Not on file    Physically abused: Not on file    Forced sexual activity: Not on file  Other Topics Concern  . Not on file  Social History Narrative  . Not on file    Past Medical History, Surgical history, Social history, and Family history were reviewed and updated as appropriate.   Please see review of systems for further details on the patient's review from today.   Objective:   Physical Exam:  There were no vitals taken for this visit.  Physical Exam Constitutional:      General: He is not in acute distress.    Appearance: He is well-developed.  Musculoskeletal:        General: No deformity.  Neurological:     Mental Status: He is alert and oriented to person, place, and time.     Motor: No tremor.     Coordination: Coordination normal.     Gait: Gait normal.  Psychiatric:        Attention and Perception: He is attentive.        Mood and Affect: Mood is not anxious or depressed. Affect is not labile, blunt, angry or inappropriate.        Speech: Speech normal.        Behavior: Behavior normal.        Thought Content: Thought content normal. Thought content does not include homicidal or suicidal ideation. Thought content does not include homicidal or suicidal plan.        Cognition and Memory: Cognition normal.        Judgment: Judgment normal.     Comments: Insight intact. No auditory or visual hallucinations. No delusions.      Lab Review:     Component Value Date/Time   NA 138 05/08/2019 0935   K 4.7  05/08/2019 0935   CL 103 05/08/2019 0935   CO2 30 05/08/2019 0935   GLUCOSE 113 05/08/2019 0935   BUN 18 05/08/2019 0935   CREATININE 1.29 05/08/2019 0935   CALCIUM 9.3 05/08/2019 0935    No results found for: WBC, RBC, HGB, HCT, PLT, MCV, MCH, MCHC, RDW, LYMPHSABS, MONOABS, EOSABS, BASOSABS  Lithium Lvl  Date Value Ref Range Status  05/08/2019 0.5 (L) 0.6 - 1.2 mmol/L Final     No results found for: PHENYTOIN, PHENOBARB, VALPROATE,  CBMZ   .res Assessment: Plan:    Julian [name] was seen today for follow-up and follow-up med changes.  Diagnoses and all orders for this visit:  Bipolar I disorder ([place])  Lithium use    Greater than 50% of face to face time with patient was spent on counseling and coordination of care. We discussed the following: Doing well.  He has had a good response to lithium addition.  He is not having any side effects to it.  However at the same time he does want to try to get off of lamotrigine and see if he can be maintained on lithium alone.  The odds are good that that will be successful if it is done gradually.  Disc the low level of lithium and possible need to increase the dosage.  Disc the dosage range.   He agrees.  Last lithium level was 0.5 which is still at the low end of the normal range. Due to low lithium level rec increase into the normal range by increasing lithium 900 mg daily . As noted below discussed side effects in detail.  He agrees with the plan.  He wants to discover whether he can be a lithium mono responder.  He also wants to see if he feels a little cognitively sharper  .He is aware there is risks and reducing the lamotrigine but wonders if lamotrigine is causing some mild cognitive dysfunction. Has seen cognitive benefit with reduction from 200 to 50 mg daily.  He has had some mild withdrawal symptoms each time he is dropped the dosage.  Offered to drop the dose to 25 mg to make that less likely to occur but he states he can handle it and  is anxious to be completely off of it.  After 2 weeks of 50 mg lamotrigine Then stop lamotrigine.  Counseled patient regarding potential benefits, risks, and side effects of lithium to include potential risk of lithium affecting thyroid and renal function.  Discussed need for periodic lab monitoring to determine drug level and to assess for potential adverse effects.  Counseled patient regarding signs and symptoms of lithium toxicity and advised that they notify office immediately or seek urgent medical attention if experiencing these signs and symptoms.  Patient advised to contact office with any questions or concerns.  He has questions around the value of the other supplements.  I recommended that he not discontinue anything except potentially the fish oil at this point because we want to maximize this because it cognitive abilities. And continue the NAC.  He is cognitively improved with the NAC and the reduction in lamotrigine.  This appt was 30 mins.  FU 3 mos  Lynder Parents, MD, DFAPA  Please see After Visit Summary for patient specific instructions.  No future appointments.  No orders of the defined types were placed in this encounter.     -------------------------------

## 2019-08-05 ENCOUNTER — Other Ambulatory Visit: Payer: Self-pay | Admitting: Psychiatry

## 2019-08-05 DIAGNOSIS — F319 Bipolar disorder, unspecified: Secondary | ICD-10-CM

## 2019-08-15 ENCOUNTER — Encounter: Payer: Self-pay | Admitting: Psychiatry

## 2019-08-15 ENCOUNTER — Ambulatory Visit (INDEPENDENT_AMBULATORY_CARE_PROVIDER_SITE_OTHER): Payer: BC Managed Care – PPO | Admitting: Psychiatry

## 2019-08-15 ENCOUNTER — Other Ambulatory Visit: Payer: Self-pay

## 2019-08-15 DIAGNOSIS — F411 Generalized anxiety disorder: Secondary | ICD-10-CM | POA: Diagnosis not present

## 2019-08-15 DIAGNOSIS — F319 Bipolar disorder, unspecified: Secondary | ICD-10-CM

## 2019-08-15 DIAGNOSIS — Z79899 Other long term (current) drug therapy: Secondary | ICD-10-CM | POA: Diagnosis not present

## 2019-08-15 MED ORDER — BUSPIRONE HCL 15 MG PO TABS: ORAL_TABLET | ORAL | 1 refills | Status: DC

## 2019-08-15 NOTE — Progress Notes (Signed)
Julian [name] [id number] [birth year] 48 y.o.  Subjective:   Patient ID:  Julian [name] is a 48 y.o. (DOB [birth year]) male.  Chief Complaint:  Chief Complaint  Patient presents with  . Follow-up    Medication Management  . Other    Bipolar 1    HPI Julian [name] presents to the office today for follow-up of adding lithium and reducing lamotrigine.  At the visit in June we increased lithium to 750 mg daily because of a low lithium level and started tapering lamotrigine in hopes that he could be a lithium mono responder.  He was also scheduled to get a lithium level.  At the last visit May 16, 2019 the following change was made: Last lithium level was 0.5 which is still at the low end of the normal range. Due to low lithium level rec increase into the normal range by increasing lithium 900 mg daily . After 2 weeks of 50 mg lamotrigine Then stop lamotrigine.  Seen with wife.  Weaning off lamotrigine was hard.  Felt bad, tingling fingers, etc.  Lasted a couple of weeks.   I like the stability I have right now.  More backbone than he had before.  A little shorter fuse.  But dealing with a higher level of anxiety than he had before which was tough. More worry and weird breathing, non-exertional.  Feels a bit short of breath. Lower interest and some depression.    [name] says he talks about it daily.  She  Notices shorter temper with her and the kids.   Also no excitement at all.  Not looking forward to things.  Seems like a lid on optimism.  Better cognition with less also of the lamotrigine.  Still believes the lithium is a wonder drug.  Very stable.  Patient reports stable mood, but a little more depressed or irritable moods.  Without lamotrigine he has more anxiety.  Positive stress of new job and gets up early to go work out.  Light sleeper.  Patient denies difficulty with sleep initiation or maintenance, but works hard to get enough sleep.  Usually taking Ambien lately and not  trazodone.  Sleep fine with it.  Denies appetite disturbance.  Patient reports that energy and motivation have been good.  Patient denies any difficulty with concentration.  Patient denies any suicidal ideation. Lately working hard and more tired the last few days.  Exercise helps. Works from home.  [name].  Past Psychiatric Medication Trials: Lithium, fluvoxamine, risperidone, lamotrigine 200 mg cognitive side effect,  Ambien, trazodone  2 prior significant manic episodes .Review of Systems:  Review of Systems  Neurological: Negative for dizziness, tremors and weakness.  Psychiatric/Behavioral: Negative for agitation, behavioral problems, confusion, decreased concentration, dysphoric mood, hallucinations, self-injury, sleep disturbance and suicidal ideas. The patient is not nervous/anxious and is not hyperactive.     Medications: I have reviewed the patient's current medications.  Current Outpatient Medications  Medication Sig Dispense Refill  . Acetylcysteine 600 MG CAPS Take 600 mg by mouth daily.    [name]. b complex vitamins tablet Take 1 tablet by mouth daily.    . cholecalciferol (VITAMIN D3) 25 MCG (1000 UT) tablet Take 1,000 Units by mouth daily.    [name]. lithium carbonate 300 MG capsule Take 3 capsules (900 mg total) by mouth daily. 270 capsule 0  . magnesium 30 MG tablet Take 30 mg by mouth daily.    . Omega-3 Fatty Acids (FISH OIL) 1000 MG CAPS Take by mouth.    .[name]  Probiotic Product (PROBIOTIC-10 PO) Take by mouth.    . traZODone (DESYREL) 50 MG tablet TAKE 1 TABLET BY MOUTH EVERY NIGHT AT BEDTIME AS NEEDED FOR INSOMNIA 30 tablet 5  . zolpidem (AMBIEN) 10 MG tablet Take 1/2-1 tablet QHS PRN insomnia 30 tablet 0  . busPIRone (BUSPAR) 15 MG tablet 1/2 tablet twice daily for 5 days then 1 twice daily 60 tablet 1   No current facility-administered medications for this visit.     Medication Side Effects: dry mouth.  No tremor nor diarrhea.    Allergies: No Known Allergies  History  reviewed. No pertinent past medical history.  History reviewed. No pertinent family history.  Social History   Socioeconomic History  . Marital status: Married    Spouse name: Not on file  . Number of children: Not on file  . Years of education: Not on file  . Highest education level: Not on file  Occupational History  . Not on file  Social Needs  . Financial resource strain: Not on file  . Food insecurity    Worry: Not on file    Inability: Not on file  . Transportation needs    Medical: Not on file    Non-medical: Not on file  Tobacco Use  . Smoking status: Never Smoker  . Smokeless tobacco: Never Used  Substance and Sexual Activity  . Alcohol use: Not on file  . Drug use: Not on file  . Sexual activity: Not on file  Lifestyle  . Physical activity    Days per week: Not on file    Minutes per session: Not on file  . Stress: Not on file  Relationships  . Social Herbalist on phone: Not on file    Gets together: Not on file    Attends religious service: Not on file    Active member of club or organization: Not on file    Attends meetings of clubs or organizations: Not on file    Relationship status: Not on file  . Intimate partner violence    Fear of current or ex partner: Not on file    Emotionally abused: Not on file    Physically abused: Not on file    Forced sexual activity: Not on file  Other Topics Concern  . Not on file  Social History Narrative  . Not on file    Past Medical History, Surgical history, Social history, and Family history were reviewed and updated as appropriate.   Please see review of systems for further details on the patient's review from today.   Objective:   Physical Exam:  There were no vitals taken for this visit.  Physical Exam Constitutional:      General: He is not in acute distress.    Appearance: He is well-developed.  Musculoskeletal:        General: No deformity.  Neurological:     Mental Status: He is  alert and oriented to person, place, and time.     Motor: No tremor.     Coordination: Coordination normal.     Gait: Gait normal.  Psychiatric:        Attention and Perception: He is attentive.        Mood and Affect: Mood is anxious and depressed. Affect is not labile, blunt, angry or inappropriate.        Speech: Speech normal.        Behavior: Behavior normal.  Thought Content: Thought content normal. Thought content does not include homicidal or suicidal ideation. Thought content does not include homicidal or suicidal plan.        Cognition and Memory: Cognition normal.        Judgment: Judgment normal.     Comments: Insight intact. No auditory or visual hallucinations. No delusions. Mild depression.  More significant anxiety     Lab Review:     Component Value Date/Time   NA 138 05/08/2019 0935   K 4.7 05/08/2019 0935   CL 103 05/08/2019 0935   CO2 30 05/08/2019 0935   GLUCOSE 113 05/08/2019 0935   BUN 18 05/08/2019 0935   CREATININE 1.29 05/08/2019 0935   CALCIUM 9.3 05/08/2019 0935    No results found for: WBC, RBC, HGB, HCT, PLT, MCV, MCH, MCHC, RDW, LYMPHSABS, MONOABS, EOSABS, BASOSABS  Lithium Lvl  Date Value Ref Range Status  05/08/2019 0.5 (L) 0.6 - 1.2 mmol/L Final     No results found for: PHENYTOIN, PHENOBARB, VALPROATE, CBMZ   .res Assessment: Plan:    Stellan was seen today for follow-up and other.  Diagnoses and all orders for this visit:  Bipolar I disorder (HCC)  Lithium use  Generalized anxiety disorder -     busPIRone (BUSPAR) 15 MG tablet; 1/2 tablet twice daily for 5 days then 1 twice daily    Greater than 50% of 30-minute face to face time with patient and his wife was spent on counseling and coordination of care. We discussed the following: He is not having any mood swings.  However he is more anxious and a little depressed with discontinuation of lamotrigine.  However he is cognitively sharper without the low  lamotrigine.  Option buspirone, restart some lamotrigine.  Discussed side effects of each option Start buspirone 15 mg tablets one half twice daily for 5 days then 1 twice daily.  If there is not substantial improvement within a couple of weeks then call back and we will increase the dosage.  If you have nausea or dizziness or other side effects that prevent use of this medicine, then call and we will restart lamotrigine and increase in 25 mg increments with a goal of 75-100 mg daily.  Want to use low dose to avoid the cognitive side effects he had at 200 mg daily.  Counseled patient regarding potential benefits, risks, and side effects of lithium to include potential risk of lithium affecting thyroid and renal function.  Discussed need for periodic lab monitoring to determine drug level and to assess for potential adverse effects.  Counseled patient regarding signs and symptoms of lithium toxicity and advised that they notify office immediately or seek urgent medical attention if experiencing these signs and symptoms.  Patient advised to contact office with any questions or concerns. Continue lithium 900 mg daily  He has questions around the value of the other supplements.  I recommended that he not discontinue anything except potentially the fish oil at this point because we want to maximize this because it cognitive abilities. And continue the NAC.  He is cognitively improved with the NAC and the reduction in lamotrigine.  This appt was 30 mins.  FU 2 mos  Meredith Staggers, MD, DFAPA  Please see After Visit Summary for patient specific instructions.  No future appointments.  No orders of the defined types were placed in this encounter.     -------------------------------

## 2019-08-27 ENCOUNTER — Other Ambulatory Visit: Payer: Self-pay | Admitting: Psychiatry

## 2019-08-27 ENCOUNTER — Telehealth: Payer: Self-pay | Admitting: Psychiatry

## 2019-08-27 MED ORDER — LAMOTRIGINE 25 MG PO TABS: ORAL_TABLET | ORAL | 0 refills | Status: DC

## 2019-08-27 NOTE — Telephone Encounter (Signed)
Pt wanted to know if he can change back to Lamotrigine. Would like to start that process. Pt has 150mg  left over.

## 2019-08-27 NOTE — Telephone Encounter (Signed)
Prescription sent for lamotrigine.  Please inform patient

## 2019-08-28 NOTE — Telephone Encounter (Signed)
Pt. Made aware.

## 2019-09-24 ENCOUNTER — Telehealth: Payer: Self-pay | Admitting: Psychiatry

## 2019-09-24 NOTE — Telephone Encounter (Signed)
Pt. Made aware.

## 2019-09-24 NOTE — Telephone Encounter (Signed)
Pt is doing well on increasing doses of Lamictal. He is taking 50mg  for the last two weeks. He is getting good benefit and wonders if he can go up to 75mg  instead of 100mg  at this time?

## 2019-09-24 NOTE — Telephone Encounter (Signed)
Yes please let him know he can increase lamotrigine to 75 mg instead of 100 mg.  I'm glad it's helping. Lynder Parents, MD, DFAPA

## 2019-09-30 ENCOUNTER — Ambulatory Visit (INDEPENDENT_AMBULATORY_CARE_PROVIDER_SITE_OTHER): Payer: BC Managed Care – PPO | Admitting: Psychiatry

## 2019-09-30 ENCOUNTER — Encounter: Payer: Self-pay | Admitting: Psychiatry

## 2019-09-30 ENCOUNTER — Other Ambulatory Visit: Payer: Self-pay

## 2019-09-30 DIAGNOSIS — F411 Generalized anxiety disorder: Secondary | ICD-10-CM

## 2019-09-30 DIAGNOSIS — Z79899 Other long term (current) drug therapy: Secondary | ICD-10-CM

## 2019-09-30 DIAGNOSIS — F319 Bipolar disorder, unspecified: Secondary | ICD-10-CM

## 2019-09-30 MED ORDER — LAMOTRIGINE 25 MG PO TABS: 75.0000 mg | ORAL_TABLET | Freq: Every day | ORAL | 1 refills | Status: DC

## 2019-09-30 NOTE — Progress Notes (Signed)
Julian [name] [id number] [birth year] 48 y.o.  Subjective:   Patient ID:  Julian [name] is a 48 y.o. (DOB [birth year]) male.  Chief Complaint:  Chief Complaint  Patient presents with  . Follow-up    Medication Medication   . Other    Bipolar 1    HPI Julian [name] presents to the office today for follow-up of adding lithium and reducing lamotrigine.  At the visit in June we increased lithium to 750 mg daily because of a low lithium level and started tapering lamotrigine in hopes that he could be a lithium mono responder.  He was also scheduled to get a lithium level.  At the visit May 16, 2019 the following change was made: Last lithium level was 0.5 which is still at the low end of the normal range. Due to low lithium level rec increase into the normal range by increasing lithium 900 mg daily . After 2 weeks of 50 mg lamotrigine Then stop lamotrigine.  At visit November started buspirone. Weaning off lamotrigine was hard.  Felt bad, tingling fingers, etc.  Lasted a couple of weeks.  November 24 called and wanted to restart lamotrigine at 75 mg today.  Noticed cognitive SE at higher doses.  [name] noticed the benefit too, more positive and relaxed.  Took buspirone a couple of weeks and N and dizziness and knocked him out at night but woke him up in the daytime.  Doesn't think he really needed the anxiety medication.  Lamotrigine accomplished what wanted with buspirone.  I feel like this is the right combination.  Even noticed restarting 25 mg he saw benefit.   I like the stability I have right now.  More backbone than he had before.  A little shorter fuse.  But dealing with a higher level of anxiety than he had before which was tough. More worry and weird breathing, non-exertional.  Feels a bit short of breath. Lower interest and some depression.    Better cognition with less also of the lamotrigine.  Still believes the lithium is a wonder drug.  Very stable.  Patient reports stable  mood, but a little more depressed or irritable moods.  Without lamotrigine he has more anxiety.  Positive stress of new job and gets up early to go work out.  Light sleeper.  Patient denies difficulty with sleep initiation or maintenance, but works hard to get enough sleep.  Usually taking Ambien lately and not trazodone.  Sleep fine with it.  Denies appetite disturbance.  Patient reports that energy and motivation have been good.  Patient denies any difficulty with concentration.  Patient denies any suicidal ideation. Lately working hard and more tired the last few days.  Exercise helps. Works from home.  [name].  Past Psychiatric Medication Trials: Lithium, fluvoxamine, risperidone, lamotrigine 200 mg cognitive side effect, buspirone SE, Ambien, trazodone  2 prior significant manic episodes  .Review of Systems:  Review of Systems  Gastrointestinal: Negative for nausea.  Neurological: Negative for dizziness, tremors and weakness.  Psychiatric/Behavioral: Negative for agitation, behavioral problems, confusion, decreased concentration, dysphoric mood, hallucinations, self-injury, sleep disturbance and suicidal ideas. The patient is not nervous/anxious and is not hyperactive.     Medications: I have reviewed the patient's current medications.  Current Outpatient Medications  Medication Sig Dispense Refill  . Acetylcysteine 600 MG CAPS Take 600 mg by mouth daily.    [name]. b complex vitamins tablet Take 1 tablet by mouth daily.    . cholecalciferol (VITAMIN D3) 25 MCG (  1000 UT) tablet Take 1,000 Units by mouth daily.    [name] lamoTRIgine (LAMICTAL) 25 MG tablet 1 daily for 2 weeks, then 2 daily for 2 weeks, then 4 daily for 2 weeks, then 6 daily (Patient taking differently: Take 75 mg by mouth daily. ) 180 tablet 0  . lithium carbonate 300 MG capsule Take 3 capsules (900 mg total) by mouth daily. 270 capsule 0  . magnesium 30 MG tablet Take 30 mg by mouth daily.    . Omega-3 Fatty Acids (FISH OIL)  1000 MG CAPS Take by mouth.    . Probiotic Product (PROBIOTIC-10 PO) Take by mouth.    . traZODone (DESYREL) 50 MG tablet TAKE 1 TABLET BY MOUTH EVERY NIGHT AT BEDTIME AS NEEDED FOR INSOMNIA 30 tablet 5  . zolpidem (AMBIEN) 10 MG tablet Take 1/2-1 tablet QHS PRN insomnia 30 tablet 0   No current facility-administered medications for this visit.    Medication Side Effects: dry mouth.  No tremor nor diarrhea.    Allergies: No Known Allergies  History reviewed. No pertinent past medical history.  History reviewed. No pertinent family history.  Social History   Socioeconomic History  . Marital status: Married    Spouse name: Not on file  . Number of children: Not on file  . Years of education: Not on file  . Highest education level: Not on file  Occupational History  . Not on file  Tobacco Use  . Smoking status: Never Smoker  . Smokeless tobacco: Never Used  Substance and Sexual Activity  . Alcohol use: Not on file  . Drug use: Not on file  . Sexual activity: Not on file  Other Topics Concern  . Not on file  Social History Narrative  . Not on file   Social Determinants of Health   Financial Resource Strain:   . Difficulty of Paying Living Expenses: Not on file  Food Insecurity:   . Worried About Charity fundraiser in the Last Year: Not on file  . Ran Out of Food in the Last Year: Not on file  Transportation Needs:   . Lack of Transportation (Medical): Not on file  . Lack of Transportation (Non-Medical): Not on file  Physical Activity:   . Days of Exercise per Week: Not on file  . Minutes of Exercise per Session: Not on file  Stress:   . Feeling of Stress : Not on file  Social Connections:   . Frequency of Communication with Friends and Family: Not on file  . Frequency of Social Gatherings with Friends and Family: Not on file  . Attends Religious Services: Not on file  . Active Member of Clubs or Organizations: Not on file  . Attends Archivist  Meetings: Not on file  . Marital Status: Not on file  Intimate Partner Violence:   . Fear of Current or Ex-Partner: Not on file  . Emotionally Abused: Not on file  . Physically Abused: Not on file  . Sexually Abused: Not on file    Past Medical History, Surgical history, Social history, and Family history were reviewed and updated as appropriate.   Please see review of systems for further details on the patient's review from today.   Objective:   Physical Exam:  There were no vitals taken for this visit.  Physical Exam Constitutional:      General: He is not in acute distress.    Appearance: He is well-developed.  Musculoskeletal:  General: No deformity.  Neurological:     Mental Status: He is alert and oriented to person, place, and time.     Motor: No tremor.     Coordination: Coordination normal.     Gait: Gait normal.  Psychiatric:        Attention and Perception: He is attentive.        Mood and Affect: Mood is not anxious or depressed. Affect is not labile, blunt, angry or inappropriate.        Speech: Speech normal.        Behavior: Behavior normal.        Thought Content: Thought content normal. Thought content does not include homicidal or suicidal ideation. Thought content does not include homicidal or suicidal plan.        Cognition and Memory: Cognition normal.        Judgment: Judgment normal.     Comments: Insight intact. No auditory or visual hallucinations. No delusions. Sx resolved with lamotrigine     Lab Review:     Component Value Date/Time   NA 138 05/08/2019 0935   K 4.7 05/08/2019 0935   CL 103 05/08/2019 0935   CO2 30 05/08/2019 0935   GLUCOSE 113 05/08/2019 0935   BUN 18 05/08/2019 0935   CREATININE 1.29 05/08/2019 0935   CALCIUM 9.3 05/08/2019 0935    No results found for: WBC, RBC, HGB, HCT, PLT, MCV, MCH, MCHC, RDW, LYMPHSABS, MONOABS, EOSABS, BASOSABS  Lithium Lvl  Date Value Ref Range Status  05/08/2019 0.5 (L) 0.6 - 1.2  mmol/L Final     No results found for: PHENYTOIN, PHENOBARB, VALPROATE, CBMZ   .res Assessment: Plan:    Franky was seen today for follow-up and other.  Diagnoses and all orders for this visit:  Bipolar I disorder (HCC)  Generalized anxiety disorder  Lithium use    Greater than 50% of 30-minute face to face time with patient and his wife was spent on counseling and coordination of care. We discussed the following: He is not having any mood swings.  However he is more anxious and a little depressed with discontinuation of lamotrigine.  However he is cognitively sharper without the low lamotrigine.  Option buspirone, restart some lamotrigine.  Discussed side effects of each option Stop buspirone bc not needed with lamotrigine 75 mg daily.  Counseled patient regarding potential benefits, risks, and side effects of lithium to include potential risk of lithium affecting thyroid and renal function.  Discussed need for periodic lab monitoring to determine drug level and to assess for potential adverse effects.  Counseled patient regarding signs and symptoms of lithium toxicity and advised that they notify office immediately or seek urgent medical attention if experiencing these signs and symptoms.  Patient advised to contact office with any questions or concerns. Continue lithium 900 mg daily  He has questions around the value of the other supplements.  I recommended that he not discontinue anything except potentially the fish oil at this point because we want to maximize this because it cognitive abilities. And continue the NAC.  He is cognitively improved with the NAC and the reduction in lamotrigine.  Check lithium level  This appt was 30 mins.  FU 2 mos  Meredith Staggers, MD, DFAPA  Please see After Visit Summary for patient specific instructions.  No future appointments.  No orders of the defined types were placed in this encounter.     -------------------------------

## 2019-10-15 ENCOUNTER — Other Ambulatory Visit: Payer: Self-pay

## 2019-10-15 MED ORDER — ZOLPIDEM TARTRATE 10 MG PO TABS: ORAL_TABLET | ORAL | 0 refills | Status: DC

## 2019-10-22 ENCOUNTER — Other Ambulatory Visit: Payer: Self-pay | Admitting: Psychiatry

## 2019-10-22 DIAGNOSIS — F319 Bipolar disorder, unspecified: Secondary | ICD-10-CM

## 2019-10-22 LAB — LITHIUM LEVEL: Lithium Lvl: 0.5 mmol/L — ABNORMAL LOW (ref 0.6–1.2)

## 2019-10-22 NOTE — Progress Notes (Signed)
Stable low lithium level.  No change indicated.

## 2019-12-03 ENCOUNTER — Telehealth: Payer: Self-pay | Admitting: Psychiatry

## 2019-12-03 NOTE — Telephone Encounter (Signed)
Okay to increase lamotrigine to 100 mg daily.  Make sure he also has an appointment with me in the next few weeks.

## 2019-12-03 NOTE — Telephone Encounter (Signed)
Patient called and said that he is experiencing anxiety mixed with depression and he would like to increase his lamotrigine from 75 mg to 100 mg. Please give him a call at [phone]

## 2019-12-03 NOTE — Telephone Encounter (Signed)
Patient notified of increase lamotrigine 100 mg, says he has enough at home now but will call when needs updated Rx.has apt 12/31/2019

## 2019-12-31 ENCOUNTER — Ambulatory Visit (INDEPENDENT_AMBULATORY_CARE_PROVIDER_SITE_OTHER): Payer: BC Managed Care – PPO | Admitting: Psychiatry

## 2019-12-31 ENCOUNTER — Other Ambulatory Visit: Payer: Self-pay

## 2019-12-31 ENCOUNTER — Encounter: Payer: Self-pay | Admitting: Psychiatry

## 2019-12-31 DIAGNOSIS — F319 Bipolar disorder, unspecified: Secondary | ICD-10-CM | POA: Diagnosis not present

## 2019-12-31 DIAGNOSIS — F411 Generalized anxiety disorder: Secondary | ICD-10-CM

## 2019-12-31 DIAGNOSIS — Z79899 Other long term (current) drug therapy: Secondary | ICD-10-CM | POA: Diagnosis not present

## 2019-12-31 MED ORDER — LITHIUM CARBONATE 300 MG PO CAPS: 1200.0000 mg | ORAL_CAPSULE | Freq: Every day | ORAL | 0 refills | Status: DC

## 2019-12-31 NOTE — Progress Notes (Signed)
Julian [name] [id number] 1971/07/03 49 y.o.  Subjective:   Patient ID:  Julian [name] is a 49 y.o. (DOB December 28, 1970) male.  Chief Complaint:  Chief Complaint  Patient presents with  . Follow-up    mood anxiety and sleep  . Anxiety    HPI [name] presents to the office today for follow-up of adding lithium and reducing lamotrigine.  At the visit in June we increased lithium to 750 mg daily because of a low lithium level and started tapering lamotrigine in hopes that he could be a lithium mono responder.  He was also scheduled to get a lithium level.  At the visit May 16, 2019 the following change was made: Last lithium level was 0.5 which is still at the low end of the normal range. Due to low lithium level rec increase into the normal range by increasing lithium 900 mg daily . After 2 weeks of 50 mg lamotrigine Then stop lamotrigine.  At visit November started buspirone. Weaning off lamotrigine was hard.  Felt bad, tingling fingers, etc.  Lasted a couple of weeks.  November 24 called and wanted to restart lamotrigine at 75 mg today.  Noticed cognitive SE at higher doses.  Wife [name] noticed the benefit too, more positive and relaxed.  Last seen December 2020.  He stated the following and no meds were changed.  I feel like this is the right combination.  Even noticed restarting 25 mg of lamotrigine he saw benefit. Better cognition with less also of the lamotrigine.  I like the stability I have right now.   As of December 31, 2019:  Seen with wife [name] 12/03/19 called and wanted to increase lamotrigine to 100 bc mixed anxiety and depression.  He thinks the right amount is 150 mg daily.  Saw benefit substantially with the increase.  Still not quite where he wants to be.  Stressful bc changing jobs to new company.  Anxiety and depression is still abnormal.   W noted SOB, poor sleep and a lot of anxiety.  Still a little depressed and irritable. No sig cognitive problems.  Still  believes the lithium is a wonder drug.  Very stable.  Patient reports stable mood, but a little more depressed or irritable moods.  Without lamotrigine he has more anxiety.  Positive stress of new job and gets up early to go work out.  Light sleeper.  Patient denies difficulty with sleep initiation or maintenance, but works hard to get enough sleep.  Usually taking Ambien lately and not trazodone.  Sleep fine with it.  Denies appetite disturbance.  Patient reports that energy and motivation have been good.  Patient denies any difficulty with concentration.  Patient denies any suicidal ideation. Lately working hard and more tired the last few days.  Exercise helps. Works from home.  Wife [name].  Past Psychiatric Medication Trials: Lithium, fluvoxamine, risperidone, lamotrigine 200 mg cognitive side effect, buspirone SE, Ambien, trazodone  2 prior significant manic episodes  .Review of Systems:  Review of Systems  Gastrointestinal: Negative for nausea.  Neurological: Negative for dizziness, tremors and weakness.  Psychiatric/Behavioral: Negative for agitation, behavioral problems, confusion, decreased concentration, dysphoric mood, hallucinations, self-injury, sleep disturbance and suicidal ideas. The patient is not nervous/anxious and is not hyperactive.     Medications: I have reviewed the patient's current medications.  Current Outpatient Medications  Medication Sig Dispense Refill  . Acetylcysteine 600 MG CAPS Take 600 mg by mouth daily.    [name] b complex vitamins tablet Take  1 tablet by mouth daily.    . cholecalciferol (VITAMIN D3) 25 MCG (1000 UT) tablet Take 1,000 Units by mouth daily.    [name] lamoTRIgine (LAMICTAL) 25 MG tablet Take 3 tablets (75 mg total) by mouth daily. 225 tablet 1  . lithium carbonate 300 MG capsule Take 4 capsules (1,200 mg total) by mouth daily. 360 capsule 0  . magnesium 30 MG tablet Take 30 mg by mouth daily.    . Omega-3 Fatty Acids (FISH OIL) 1000 MG CAPS Take by  mouth.    . Probiotic Product (PROBIOTIC-10 PO) Take by mouth.    . traZODone (DESYREL) 50 MG tablet TAKE 1 TABLET BY MOUTH EVERY NIGHT AT BEDTIME AS NEEDED FOR INSOMNIA 30 tablet 5  . zolpidem (AMBIEN) 10 MG tablet Take 1/2-1 tablet QHS PRN insomnia 90 tablet 0   No current facility-administered medications for this visit.    Medication Side Effects: dry mouth.  No tremor nor diarrhea.    Allergies: No Known Allergies  No past medical history on file.  No family history on file.  Social History   Socioeconomic History  . Marital status: Married    Spouse name: Not on file  . Number of children: Not on file  . Years of education: Not on file  . Highest education level: Not on file  Occupational History  . Not on file  Tobacco Use  . Smoking status: Never Smoker  . Smokeless tobacco: Never Used  Substance and Sexual Activity  . Alcohol use: Not on file  . Drug use: Not on file  . Sexual activity: Not on file  Other Topics Concern  . Not on file  Social History Narrative  . Not on file   Social Determinants of Health   Financial Resource Strain:   . Difficulty of Paying Living Expenses:   Food Insecurity:   . Worried About Programme researcher, broadcasting/film/video in the Last Year:   . Barista in the Last Year:   Transportation Needs:   . Freight forwarder (Medical):   [name] Lack of Transportation (Non-Medical):   Physical Activity:   . Days of Exercise per Week:   . Minutes of Exercise per Session:   Stress:   . Feeling of Stress :   Social Connections:   . Frequency of Communication with Friends and Family:   . Frequency of Social Gatherings with Friends and Family:   . Attends Religious Services:   . Active Member of Clubs or Organizations:   . Attends Banker Meetings:   [name] Marital Status:   Intimate Partner Violence:   . Fear of Current or Ex-Partner:   . Emotionally Abused:   [name] Physically Abused:   . Sexually Abused:     Past Medical History,  Surgical history, Social history, and Family history were reviewed and updated as appropriate.   Please see review of systems for further details on the patient's review from today.   Objective:   Physical Exam:  There were no vitals taken for this visit.  Physical Exam Constitutional:      General: He is not in acute distress.    Appearance: He is well-developed.  Musculoskeletal:        General: No deformity.  Neurological:     Mental Status: He is alert and oriented to person, place, and time.     Motor: No tremor.     Coordination: Coordination normal.     Gait: Gait normal.  Psychiatric:        Attention and Perception: He is attentive.        Mood and Affect: Mood is anxious and depressed. Affect is not labile, blunt, angry or inappropriate.        Speech: Speech normal.        Behavior: Behavior normal.        Thought Content: Thought content normal. Thought content does not include homicidal or suicidal ideation. Thought content does not include homicidal or suicidal plan.        Cognition and Memory: Cognition normal.        Judgment: Judgment normal.     Comments: Insight intact. No auditory or visual hallucinations. No delusions. .      Lab Review:     Component Value Date/Time   NA 138 05/08/2019 0935   K 4.7 05/08/2019 0935   CL 103 05/08/2019 0935   CO2 30 05/08/2019 0935   GLUCOSE 113 05/08/2019 0935   BUN 18 05/08/2019 0935   CREATININE 1.29 05/08/2019 0935   CALCIUM 9.3 05/08/2019 0935    No results found for: WBC, RBC, HGB, HCT, PLT, MCV, MCH, MCHC, RDW, LYMPHSABS, MONOABS, EOSABS, BASOSABS  Lithium Lvl  Date Value Ref Range Status  10/21/2019 0.5 (L) 0.6 - 1.2 mmol/L Final     No results found for: PHENYTOIN, PHENOBARB, VALPROATE, CBMZ   .res Assessment: Plan:    Jonta was seen today for follow-up and anxiety.  Diagnoses and all orders for this visit:  Bipolar I disorder (HCC) -     lithium carbonate 300 MG capsule; Take 4 capsules  (1,200 mg total) by mouth daily.  Generalized anxiety disorder  Lithium use    Greater than 50% of 30-minute face to face time with patient and his wife was spent on counseling and coordination of care. We discussed the following: He is not having any mood swings.  However he is more anxious and a little depressed with low dose of lamotrigine.  However he is cognitively sharper with low-dosel lamotrigine.  Option increase lamotrigine vs lithium.  Disc Se concerns.  Disc pros cons.  Increase lithium to 1200 mg daily. If this fails to improve his residual symptoms of depression and anxiety then we will reduced lithium back to 900 mg daily and increase lamotrigine to either 125 or 150 mg daily. He knows at higher doses of lamotrigine he does have some mild cognitive side effects of it therefore increasing the lithium may prevent that side effect and resolve his symptoms.  Discussed side effects of each option  Counseled patient regarding potential benefits, risks, and side effects of lithium to include potential risk of lithium affecting thyroid and renal function.  Discussed need for periodic lab monitoring to determine drug level and to assess for potential adverse effects.  Counseled patient regarding signs and symptoms of lithium toxicity and advised that they notify office immediately or seek urgent medical attention if experiencing these signs and symptoms.  Patient advised to contact office with any questions or concerns.  He has questions around the value of the other supplements.  I recommended that he not discontinue anything except potentially the fish oil at this point because we want to maximize this because it cognitive abilities. And continue the NAC.  He is cognitively improved with the NAC and the reduction in lamotrigine.  Check lithium level if he stays at 1200 mg daily after the next visit.  This appt was 30 mins.  FU 4-6  weeks  Meredith Staggers, MD, DFAPA  Please see After  Visit Summary for patient specific instructions.  No future appointments.  No orders of the defined types were placed in this encounter.     -------------------------------

## 2020-01-29 ENCOUNTER — Ambulatory Visit (INDEPENDENT_AMBULATORY_CARE_PROVIDER_SITE_OTHER): Payer: BC Managed Care – PPO | Admitting: Psychiatry

## 2020-01-29 ENCOUNTER — Encounter: Payer: Self-pay | Admitting: Psychiatry

## 2020-01-29 ENCOUNTER — Other Ambulatory Visit: Payer: Self-pay

## 2020-01-29 DIAGNOSIS — F319 Bipolar disorder, unspecified: Secondary | ICD-10-CM

## 2020-01-29 DIAGNOSIS — Z79899 Other long term (current) drug therapy: Secondary | ICD-10-CM

## 2020-01-29 DIAGNOSIS — F411 Generalized anxiety disorder: Secondary | ICD-10-CM | POA: Diagnosis not present

## 2020-01-29 MED ORDER — LAMOTRIGINE 150 MG PO TABS: 150.0000 mg | ORAL_TABLET | Freq: Every day | ORAL | 0 refills | Status: DC

## 2020-01-29 NOTE — Progress Notes (Signed)
Julian [name] [id number] 1971/01/17 49 y.o.  Subjective:   Patient ID:  Julian [name] is a 49 y.o. (DOB 10/15/1970) male.  Chief Complaint:  Chief Complaint  Patient presents with  . Follow-up  . Anxiety  . Depression  . Medication Problem    HPI Julian [name] presents to the office today for follow-up of adding lithium and reducing lamotrigine.  At the visit in June we increased lithium to 750 mg daily because of a low lithium level and started tapering lamotrigine in hopes that he could be a lithium mono responder.  He was also scheduled to get a lithium level.  At the visit May 16, 2019 the following change was made: Last lithium level was 0.5 which is still at the low end of the normal range. Due to low lithium level rec increase into the normal range by increasing lithium 900 mg daily . After 2 weeks of 50 mg lamotrigine Then stop lamotrigine.  At visit November started buspirone. Weaning off lamotrigine was hard.  Felt bad, tingling fingers, etc.  Lasted a couple of weeks.  November 24 called and wanted to restart lamotrigine at 75 mg today.  Noticed cognitive SE at higher doses.  [name] noticed the benefit too, more positive and relaxed.  Last seen December 2020.  He stated the following and no meds were changed.  I feel like this is the right combination.  Even noticed restarting 25 mg of lamotrigine he saw benefit. Better cognition with less also of the lamotrigine.  I like the stability I have right now.   As of [name] 30, 2021:  Seen with [name] 12/03/19 called and wanted to increase lamotrigine to 100 bc mixed anxiety and depression.  He thinks the right amount is 150 mg daily.  Saw benefit substantially with the increase.  Still not quite where he wants to be.  Stressful bc changing jobs to new company.  Anxiety and depression is still abnormal.   W noted SOB, poor sleep and a lot of anxiety.  Still a little depressed and irritable. No sig cognitive  problems. Plan:  He is not having any mood swings.  However he is more anxious and a little depressed with low dose of lamotrigine.  However he is cognitively sharper with low-dosel lamotrigine. Option increase lamotrigine vs lithium.  Disc Se concerns.  Disc pros cons.  Increase lithium to 1200 mg daily. If this fails to improve his residual symptoms of depression and anxiety then we will reduced lithium back to 900 mg daily and increase lamotrigine to either 125 or 150 mg daily. He knows at higher doses of lamotrigine he does have some mild cognitive side effects of it therefore increasing the lithium may prevent that side effect and resolve his symptoms.  January 29, 2020 appointment the following is noted: Took lithium 1200 mg for 9 days bc it didn't helpt the irritability enough or depression.  So increased lamotrigine to 150 mg daily and dropped lithium to 900 mg for the last few weeks.  Been really good since.   This is the best I've felt in a long time.  Tolerated cognitively well.    Still believes the lithium is a wonder drug.  Very stable.  Patient reports stable mood, and less depressed or irritable moods.  Without lamotrigine he has more anxiety and irritability.  Still is more intense and harder to relax than it used to be but mild.  Positive stress of new job and gets up  early to go work out.  Light sleeper.  Patient denies difficulty with sleep initiation or maintenance, but works hard to get enough sleep.  Usually taking Ambien lately and not trazodone.  Sleep fine with it.  Denies appetite disturbance.  Patient reports that energy and motivation have been good.  Patient denies any difficulty with concentration.  Patient denies any suicidal ideation. Lately working hard and more tired the last few days.  Exercise helps. Works from home.  [name].  Past Psychiatric Medication Trials: Lithium, fluvoxamine, risperidone, lamotrigine 200 mg cognitive side effect, buspirone SE, Ambien,  trazodone  2 prior significant manic episodes  .Review of Systems:  Review of Systems  Gastrointestinal: Negative for nausea.  Neurological: Negative for dizziness, tremors and weakness.  Psychiatric/Behavioral: Negative for agitation, behavioral problems, confusion, decreased concentration, dysphoric mood, hallucinations, self-injury, sleep disturbance and suicidal ideas. The patient is not nervous/anxious and is not hyperactive.     Medications: I have reviewed the patient's current medications.  Current Outpatient Medications  Medication Sig Dispense Refill  . Acetylcysteine 600 MG CAPS Take 600 mg by mouth daily.    [name] b complex vitamins tablet Take 1 tablet by mouth daily.    . cholecalciferol (VITAMIN D3) 25 MCG (1000 UT) tablet Take 1,000 Units by mouth daily.    [name] lamoTRIgine (LAMICTAL) 150 MG tablet Take 1 tablet (150 mg total) by mouth daily. 90 tablet 0  . lithium carbonate 300 MG capsule Take 4 capsules (1,200 mg total) by mouth daily. (Patient taking differently: Take 900 mg by mouth daily. ) 360 capsule 0  . magnesium 30 MG tablet Take 30 mg by mouth daily.    . Omega-3 Fatty Acids (FISH OIL) 1000 MG CAPS Take by mouth.    . Probiotic Product (PROBIOTIC-10 PO) Take by mouth.    . traZODone (DESYREL) 50 MG tablet TAKE 1 TABLET BY MOUTH EVERY NIGHT AT BEDTIME AS NEEDED FOR INSOMNIA 30 tablet 5  . zolpidem (AMBIEN) 10 MG tablet Take 1/2-1 tablet QHS PRN insomnia 90 tablet 0   No current facility-administered medications for this visit.    Medication Side Effects: dry mouth.  No tremor nor diarrhea.    Allergies: No Known Allergies  No past medical history on file.  No family history on file.  Social History   Socioeconomic History  . Marital status: Married    Spouse name: Not on file  . Number of children: Not on file  . Years of education: Not on file  . Highest education level: Not on file  Occupational History  . Not on file  Tobacco Use  . Smoking  status: Never Smoker  . Smokeless tobacco: Never Used  Substance and Sexual Activity  . Alcohol use: Not on file  . Drug use: Not on file  . Sexual activity: Not on file  Other Topics Concern  . Not on file  Social History Narrative  . Not on file   Social Determinants of Health   Financial Resource Strain:   . Difficulty of Paying Living Expenses:   Food Insecurity:   . Worried About Charity fundraiser in the Last Year:   . Arboriculturist in the Last Year:   Transportation Needs:   . Film/video editor (Medical):   [name] Lack of Transportation (Non-Medical):   Physical Activity:   . Days of Exercise per Week:   . Minutes of Exercise per Session:   Stress:   . Feeling of Stress :  Social Connections:   . Frequency of Communication with Friends and Family:   . Frequency of Social Gatherings with Friends and Family:   . Attends Religious Services:   . Active Member of Clubs or Organizations:   . Attends Banker Meetings:   [name] Marital Status:   Intimate Partner Violence:   . Fear of Current or Ex-Partner:   . Emotionally Abused:   [name] Physically Abused:   . Sexually Abused:     Past Medical History, Surgical history, Social history, and Family history were reviewed and updated as appropriate.   Please see review of systems for further details on the patient's review from today.   Objective:   Physical Exam:  There were no vitals taken for this visit.  Physical Exam Constitutional:      General: He is not in acute distress.    Appearance: He is well-developed.  Musculoskeletal:        General: No deformity.  Neurological:     Mental Status: He is alert and oriented to person, place, and time.     Motor: No tremor.     Coordination: Coordination normal.     Gait: Gait normal.  Psychiatric:        Attention and Perception: He is attentive.        Mood and Affect: Mood is not anxious or depressed. Affect is not labile, blunt, angry or inappropriate.         Speech: Speech normal.        Behavior: Behavior normal.        Thought Content: Thought content normal. Thought content does not include homicidal or suicidal ideation. Thought content does not include homicidal or suicidal plan.        Cognition and Memory: Cognition normal.        Judgment: Judgment normal.     Comments: Insight intact. No auditory or visual hallucinations. No delusions. .      Lab Review:     Component Value Date/Time   NA 138 05/08/2019 0935   K 4.7 05/08/2019 0935   CL 103 05/08/2019 0935   CO2 30 05/08/2019 0935   GLUCOSE 113 05/08/2019 0935   BUN 18 05/08/2019 0935   CREATININE 1.29 05/08/2019 0935   CALCIUM 9.3 05/08/2019 0935    No results found for: WBC, RBC, HGB, HCT, PLT, MCV, MCH, MCHC, RDW, LYMPHSABS, MONOABS, EOSABS, BASOSABS  Lithium Lvl  Date Value Ref Range Status  10/21/2019 0.5 (L) 0.6 - 1.2 mmol/L Final     No results found for: PHENYTOIN, PHENOBARB, VALPROATE, CBMZ   .res Assessment: Plan:    Harsha was seen today for follow-up, anxiety, depression and medication problem.  Diagnoses and all orders for this visit:  Bipolar I disorder (HCC) -     lamoTRIgine (LAMICTAL) 150 MG tablet; Take 1 tablet (150 mg total) by mouth daily.  Generalized anxiety disorder  Lithium use    Greater than 50% of 30-minute face to face time with patient and his wife was spent on counseling and coordination of care. We discussed the following: He is not having any mood swings. He feels the best in a year.  However he is cognitively sharper with low-dosel lamotrigine.  Option increase lamotrigine vs lithium.  Disc Se concerns.  Disc pros cons.  Continue lithium to 900 mg daily. Continue lamotrigine 150 mg daily. He knows at higher doses of lamotrigine he does have some mild cognitive side effects of it therefore increasing the  lithium may prevent that side effect and resolve his symptoms.  Discussed side effects of each  option  Counseled patient regarding potential benefits, risks, and side effects of lithium to include potential risk of lithium affecting thyroid and renal function.  Discussed need for periodic lab monitoring to determine drug level and to assess for potential adverse effects.  Counseled patient regarding signs and symptoms of lithium toxicity and advised that they notify office immediately or seek urgent medical attention if experiencing these signs and symptoms.  Patient advised to contact office with any questions or concerns.  He has questions around the value of the other supplements.  I recommended that he not discontinue anything except potentially the fish oil at this point because we want to maximize this because it cognitive abilities. And continue the NAC.  He is cognitively improved with the NAC and the reduction in lamotrigine.  He thinks maybe lithium gives him a little edginess  This appt was 30 mins.  FU 3 mos  Meredith Staggers, MD, DFAPA  Please see After Visit Summary for patient specific instructions.  No future appointments.  No orders of the defined types were placed in this encounter.     -------------------------------

## 2020-04-29 ENCOUNTER — Ambulatory Visit (INDEPENDENT_AMBULATORY_CARE_PROVIDER_SITE_OTHER): Payer: 59 | Admitting: Psychiatry

## 2020-04-29 ENCOUNTER — Encounter: Payer: Self-pay | Admitting: Psychiatry

## 2020-04-29 ENCOUNTER — Other Ambulatory Visit: Payer: Self-pay

## 2020-04-29 DIAGNOSIS — F411 Generalized anxiety disorder: Secondary | ICD-10-CM

## 2020-04-29 DIAGNOSIS — Z79899 Other long term (current) drug therapy: Secondary | ICD-10-CM

## 2020-04-29 DIAGNOSIS — F319 Bipolar disorder, unspecified: Secondary | ICD-10-CM | POA: Diagnosis not present

## 2020-04-29 MED ORDER — LAMOTRIGINE 150 MG PO TABS: 150.0000 mg | ORAL_TABLET | Freq: Every day | ORAL | 1 refills | Status: DC

## 2020-04-29 MED ORDER — LITHIUM CARBONATE 300 MG PO CAPS: 900.0000 mg | ORAL_CAPSULE | Freq: Every day | ORAL | 1 refills | Status: DC

## 2020-04-29 NOTE — Progress Notes (Signed)
Julian [name] [id number] August 03, 1971 49 y.o.  Subjective:   Patient ID:  Julian [name] is a 49 y.o. (DOB August 03, 1971) male.  Chief Complaint:  Chief Complaint  Patient presents with  . Follow-up  . Anxiety  . Depression    HPI Julian [name] presents to the office today for follow-up of adding lithium and reducing lamotrigine.  At the visit in June we increased lithium to 750 mg daily because of a low lithium level and started tapering lamotrigine in hopes that he could be a lithium mono responder.  He was also scheduled to get a lithium level.  At the visit May 16, 2019 the following change was made: Last lithium level was 0.5 which is still at the low end of the normal range. Due to low lithium level rec increase into the normal range by increasing lithium 900 mg daily . After 2 weeks of 50 mg lamotrigine Then stop lamotrigine.  At visit November started buspirone. Weaning off lamotrigine was hard.  Felt bad, tingling fingers, etc.  Lasted a couple of weeks.  November 24 called and wanted to restart lamotrigine at 75 mg today.  Noticed cognitive SE at higher doses.  [name] noticed the benefit too, more positive and relaxed.  Last seen December 2020.  He stated the following and no meds were changed.  I feel like this is the right combination.  Even noticed restarting 25 mg of lamotrigine he saw benefit. Better cognition with less also of the lamotrigine.  I like the stability I have right now.   As of December 31, 2019:  Seen with [name] Called 12/03/19 called and wanted to increase lamotrigine to 100 bc mixed anxiety and depression.  He thinks the right amount is 150 mg daily.  Saw benefit substantially with the increase.  Still not quite where he wants to be.  Stressful bc changing jobs to new company.  Anxiety and depression is still abnormal.   W noted SOB, poor sleep and a lot of anxiety.  Still a little depressed and irritable. No sig cognitive problems. Plan:  He is not  having any mood swings.  However he is more anxious and a little depressed with low dose of lamotrigine.  However he is cognitively sharper with low-dosel lamotrigine. Option increase lamotrigine vs lithium.  Disc Se concerns.  Disc pros cons.  Increase lithium to 1200 mg daily. If this fails to improve his residual symptoms of depression and anxiety then we will reduced lithium back to 900 mg daily and increase lamotrigine to either 125 or 150 mg daily. He knows at higher doses of lamotrigine he does have some mild cognitive side effects of it therefore increasing the lithium may prevent that side effect and resolve his symptoms.  January 29, 2020 appointment the following is noted: Took lithium 1200 mg for 9 days bc it didn't helpt the irritability enough or depression.  So increased lamotrigine to 150 mg daily and dropped lithium to 900 mg for the last few weeks.  Been really good since.   This is the best I've felt in a long time.  Tolerated cognitively well.   Still believes the lithium is a wonder drug.  Very stable.  Patient reports stable mood, and less depressed or irritable moods.  Without lamotrigine he has more anxiety and irritability.  Still is more intense and harder to relax than it used to be but mild.  Positive stress of new job and gets up early to go work out.  Light sleeper.  Patient denies difficulty with sleep initiation or maintenance, but works hard to get enough sleep.  Usually taking Ambien lately and not trazodone.  Sleep fine with it.  Denies appetite disturbance.  Patient reports that energy and motivation have been good.  Patient denies any difficulty with concentration.  Patient denies any suicidal ideation. Lately working hard and more tired the last few days. Option increase lamotrigine vs lithium.  Disc Se concerns.  Disc pros cons.  Continue lithium to 900 mg daily. Continue lamotrigine 150 mg daily.  04/29/20 appt with the following noted: Taking NAC longterm. Really  pleased where I am cognitively, mood and anxiety. Pleased with doses and SE. Patient reports stable mood and denies depressed or irritable moods.  Patient denies any recent difficulty with anxiety.  Patient denies difficulty with sleep initiation or maintenance but chronic worry over sleep. Ambien rarely and trazodone more rare.  Denies appetite disturbance.  Patient reports that energy and motivation have been good.  Patient denies any difficulty with concentration.  Patient denies any suicidal ideation. Recognizes at 200 lamotrigine had sig cog problems.  Wonders about lithium delaying response time.  Exercise helps. Works from home.  [name].  Past Psychiatric Medication Trials: Lithium, fluvoxamine, risperidone, lamotrigine 200 mg cognitive side effect, buspirone SE, Ambien, trazodone  2 prior significant manic episodes  .Review of Systems:  Review of Systems  Gastrointestinal: Negative for diarrhea and nausea.  Neurological: Negative for dizziness, tremors and weakness.  Psychiatric/Behavioral: Negative for agitation, behavioral problems, confusion, decreased concentration, dysphoric mood, hallucinations, self-injury, sleep disturbance and suicidal ideas. The patient is not nervous/anxious and is not hyperactive.     Medications: I have reviewed the patient's current medications.  Current Outpatient Medications  Medication Sig Dispense Refill  . Acetylcysteine 600 MG CAPS Take 600 mg by mouth daily.    [name] b complex vitamins tablet Take 1 tablet by mouth daily.    . cholecalciferol (VITAMIN D3) 25 MCG (1000 UT) tablet Take 1,000 Units by mouth daily.    [name] lamoTRIgine (LAMICTAL) 150 MG tablet Take 1 tablet (150 mg total) by mouth daily. 90 tablet 1  . lithium carbonate 300 MG capsule Take 3 capsules (900 mg total) by mouth daily. 270 capsule 1  . magnesium 30 MG tablet Take 30 mg by mouth daily.    . Omega-3 Fatty Acids (FISH OIL) 1000 MG CAPS Take by mouth.    . Probiotic  Product (PROBIOTIC-10 PO) Take by mouth.    . traZODone (DESYREL) 50 MG tablet TAKE 1 TABLET BY MOUTH EVERY NIGHT AT BEDTIME AS NEEDED FOR INSOMNIA 30 tablet 5  . zolpidem (AMBIEN) 10 MG tablet Take 1/2-1 tablet QHS PRN insomnia 90 tablet 0   No current facility-administered medications for this visit.    Medication Side Effects: dry mouth.  No tremor nor diarrhea.    Allergies: No Known Allergies  History reviewed. No pertinent past medical history.  History reviewed. No pertinent family history.  Social History   Socioeconomic History  . Marital status: Married    Spouse name: Not on file  . Number of children: Not on file  . Years of education: Not on file  . Highest education level: Not on file  Occupational History  . Not on file  Tobacco Use  . Smoking status: Never Smoker  . Smokeless tobacco: Never Used  Substance and Sexual Activity  . Alcohol use: Not on file  . Drug use: Not on file  . Sexual  activity: Not on file  Other Topics Concern  . Not on file  Social History Narrative  . Not on file   Social Determinants of Health   Financial Resource Strain:   . Difficulty of Paying Living Expenses:   Food Insecurity:   . Worried About Programme researcher, broadcasting/film/video in the Last Year:   . Barista in the Last Year:   Transportation Needs:   . Freight forwarder (Medical):   [name] Lack of Transportation (Non-Medical):   Physical Activity:   . Days of Exercise per Week:   . Minutes of Exercise per Session:   Stress:   . Feeling of Stress :   Social Connections:   . Frequency of Communication with Friends and Family:   . Frequency of Social Gatherings with Friends and Family:   . Attends Religious Services:   . Active Member of Clubs or Organizations:   . Attends Banker Meetings:   [name] Marital Status:   Intimate Partner Violence:   . Fear of Current or Ex-Partner:   . Emotionally Abused:   [name] Physically Abused:   . Sexually Abused:     Past  Medical History, Surgical history, Social history, and Family history were reviewed and updated as appropriate.   Please see review of systems for further details on the patient's review from today.   Objective:   Physical Exam:  There were no vitals taken for this visit.  Physical Exam Constitutional:      General: He is not in acute distress.    Appearance: He is well-developed.  Musculoskeletal:        General: No deformity.  Neurological:     Mental Status: He is alert and oriented to person, place, and time.     Motor: No tremor.     Coordination: Coordination normal.     Gait: Gait normal.  Psychiatric:        Attention and Perception: He is attentive.        Mood and Affect: Mood is not anxious or depressed. Affect is not labile, blunt, angry or inappropriate.        Speech: Speech normal. Speech is not rapid and pressured.        Behavior: Behavior normal.        Thought Content: Thought content normal. Thought content does not include homicidal or suicidal ideation. Thought content does not include homicidal or suicidal plan.        Cognition and Memory: Cognition normal.        Judgment: Judgment normal.     Comments: Insight intact. No auditory or visual hallucinations. No delusions. .      Lab Review:     Component Value Date/Time   NA 138 05/08/2019 0935   K 4.7 05/08/2019 0935   CL 103 05/08/2019 0935   CO2 30 05/08/2019 0935   GLUCOSE 113 05/08/2019 0935   BUN 18 05/08/2019 0935   CREATININE 1.29 05/08/2019 0935   CALCIUM 9.3 05/08/2019 0935    No results found for: WBC, RBC, HGB, HCT, PLT, MCV, MCH, MCHC, RDW, LYMPHSABS, MONOABS, EOSABS, BASOSABS  Lithium Lvl  Date Value Ref Range Status  10/21/2019 0.5 (L) 0.6 - 1.2 mmol/L Final     No results found for: PHENYTOIN, PHENOBARB, VALPROATE, CBMZ   .res Assessment: Plan:    Abdulaziz was seen today for follow-up, anxiety and depression.  Diagnoses and all orders for this visit:  Bipolar I  disorder (HCC) -  lamoTRIgine (LAMICTAL) 150 MG tablet; Take 1 tablet (150 mg total) by mouth daily. -     lithium carbonate 300 MG capsule; Take 3 capsules (900 mg total) by mouth daily. -     Basic metabolic panel -     Lithium level  Generalized anxiety disorder  Lithium use -     Basic metabolic panel -     Lithium level    Greater than 50% of 30-minute face to face time with patient and his wife was spent on counseling and coordination of care. We discussed the following: He is not having any mood swings. He feels the best in a year.  However he is cognitively sharper with low-dosel lamotrigine.  Option increase lamotrigine vs lithium.  Disc Se concerns.  Disc pros cons.  Continue lithium to 900 mg daily. Continue lamotrigine 150 mg daily. He knows at higher doses of lamotrigine he does have some mild cognitive side effects of it therefore increasing the lithium may prevent that side effect and resolve his symptoms.  Discussed side effects of each option  Counseled patient regarding potential benefits, risks, and side effects of lithium to include potential risk of lithium affecting thyroid and renal function.  Discussed need for periodic lab monitoring to determine drug level and to assess for potential adverse effects.  Counseled patient regarding signs and symptoms of lithium toxicity and advised that they notify office immediately or seek urgent medical attention if experiencing these signs and symptoms.  Patient advised to contact office with any questions or concerns. Lithium level 0.5 1/21.  He has questions around the value of the other supplements.  I recommended that he not discontinue anything except potentially the fish oil at this point because we want to maximize this because it cognitive abilities. And continue the NAC.  He is cognitively improved with the NAC and the reduction in lamotrigine.  This appt was 30 mins.  FU 6 mos  Meredith Staggers, MD, DFAPA  Please  see After Visit Summary for patient specific instructions.  No future appointments.  Orders Placed This Encounter  Procedures  . Basic metabolic panel  . Lithium level      -------------------------------

## 2020-05-27 LAB — BASIC METABOLIC PANEL
BUN: 19 mg/dL (ref 7–25)
CO2: 28 mmol/L (ref 20–32)
Calcium: 9.5 mg/dL (ref 8.6–10.3)
Chloride: 103 mmol/L (ref 98–110)
Creat: 1.15 mg/dL (ref 0.60–1.35)
Glucose, Bld: 111 mg/dL (ref 65–139)
Potassium: 4.1 mmol/L (ref 3.5–5.3)
Sodium: 138 mmol/L (ref 135–146)

## 2020-05-27 LAB — LITHIUM LEVEL: Lithium Lvl: 0.6 mmol/L (ref 0.6–1.2)

## 2020-10-29 ENCOUNTER — Other Ambulatory Visit: Payer: Self-pay

## 2020-10-29 ENCOUNTER — Encounter: Payer: Self-pay | Admitting: Psychiatry

## 2020-10-29 ENCOUNTER — Ambulatory Visit (INDEPENDENT_AMBULATORY_CARE_PROVIDER_SITE_OTHER): Payer: 59 | Admitting: Psychiatry

## 2020-10-29 DIAGNOSIS — Z79899 Other long term (current) drug therapy: Secondary | ICD-10-CM | POA: Diagnosis not present

## 2020-10-29 DIAGNOSIS — F5105 Insomnia due to other mental disorder: Secondary | ICD-10-CM

## 2020-10-29 DIAGNOSIS — F411 Generalized anxiety disorder: Secondary | ICD-10-CM | POA: Diagnosis not present

## 2020-10-29 DIAGNOSIS — F319 Bipolar disorder, unspecified: Secondary | ICD-10-CM

## 2020-10-29 MED ORDER — LITHIUM CARBONATE 300 MG PO CAPS: 900.0000 mg | ORAL_CAPSULE | Freq: Every day | ORAL | 1 refills | Status: DC

## 2020-10-29 MED ORDER — LAMOTRIGINE 150 MG PO TABS: 150.0000 mg | ORAL_TABLET | Freq: Every day | ORAL | 1 refills | Status: DC

## 2020-10-29 NOTE — Progress Notes (Signed)
Julian [name] [id number] 1970/10/05 50 y.o.  Subjective:   Patient ID:  Julian [name] is a 50 y.o. (DOB October 24, 1970) male.  Chief Complaint:  Chief Complaint  Patient presents with  . Follow-up    Depression     HPI Julian [name] presents to the office today for follow-up of adding lithium and reducing lamotrigine.  At the visit in June we increased lithium to 750 mg daily because of a low lithium level and started tapering lamotrigine in hopes that he could be a lithium mono responder.  He was also scheduled to get a lithium level.  At the visit May 16, 2019 the following change was made: Last lithium level was 0.5 which is still at the low end of the normal range. Due to low lithium level rec increase into the normal range by increasing lithium 900 mg daily . After 2 weeks of 50 mg lamotrigine Then stop lamotrigine.  At visit November started buspirone. Weaning off lamotrigine was hard.  Felt bad, tingling fingers, etc.  Lasted a couple of weeks.  November 24 called and wanted to restart lamotrigine at 75 mg today.  Noticed cognitive SE at higher doses.  [name] noticed the benefit too, more positive and relaxed.  Last seen December 2020.  He stated the following and no meds were changed.  I feel like this is the right combination.  Even noticed restarting 25 mg of lamotrigine he saw benefit. Better cognition with less also of the lamotrigine.  I like the stability I have right now.   As of [name] 30, 2021:  Seen with [name] 12/03/19 called and wanted to increase lamotrigine to 100 bc mixed anxiety and depression.  He thinks the right amount is 150 mg daily.  Saw benefit substantially with the increase.  Still not quite where he wants to be.  Stressful bc changing jobs to new company.  Anxiety and depression is still abnormal.   W noted SOB, poor sleep and a lot of anxiety.  Still a little depressed and irritable. No sig cognitive problems. Plan:  He is not having any mood  swings.  However he is more anxious and a little depressed with low dose of lamotrigine.  However he is cognitively sharper with low-dosel lamotrigine. Option increase lamotrigine vs lithium.  Disc Se concerns.  Disc pros cons.  Increase lithium to 1200 mg daily. If this fails to improve his residual symptoms of depression and anxiety then we will reduced lithium back to 900 mg daily and increase lamotrigine to either 125 or 150 mg daily. He knows at higher doses of lamotrigine he does have some mild cognitive side effects of it therefore increasing the lithium may prevent that side effect and resolve his symptoms.  January 29, 2020 appointment the following is noted: Took lithium 1200 mg for 9 days bc it didn't helpt the irritability enough or depression.  So increased lamotrigine to 150 mg daily and dropped lithium to 900 mg for the last few weeks.  Been really good since.   This is the best I've felt in a long time.  Tolerated cognitively well.   Still believes the lithium is a wonder drug.  Very stable.  Patient reports stable mood, and less depressed or irritable moods.  Without lamotrigine he has more anxiety and irritability.  Still is more intense and harder to relax than it used to be but mild.  Positive stress of new job and gets up early to go work out.  Light sleeper.  Patient denies difficulty with sleep initiation or maintenance, but works hard to get enough sleep.  Usually taking Ambien lately and not trazodone.  Sleep fine with it.  Denies appetite disturbance.  Patient reports that energy and motivation have been good.  Patient denies any difficulty with concentration.  Patient denies any suicidal ideation. Lately working hard and more tired the last few days. Option increase lamotrigine vs lithium.  Disc Se concerns.  Disc pros cons.  Continue lithium to 900 mg daily. Continue lamotrigine 150 mg daily.  04/29/20 appt with the following noted: Taking NAC longterm. Really pleased where I  am cognitively, mood and anxiety. Pleased with doses and SE. Patient reports stable mood and denies depressed or irritable moods.  Patient denies any recent difficulty with anxiety.  Patient denies difficulty with sleep initiation or maintenance but chronic worry over sleep. Ambien rarely and trazodone more rare.  Denies appetite disturbance.  Patient reports that energy and motivation have been good.  Patient denies any difficulty with concentration.  Patient denies any suicidal ideation. Recognizes at 200 lamotrigine had sig cog problems.  Wonders about lithium delaying response time. Plan no changes  10/29/20 appt noted: Still doing well.  Wants to have more intensity and engagement.  But happy and content and productive.  Lost a little edge for pushing but it's not enough to make a change.  No concerns from wife.   Sleep great with melatonin 5 mg.  Ambien 2 monthly.  Not a big fan of trazodone. Patient reports stable mood and denies depressed or irritable moods.  Patient denies any recent difficulty with anxiety.  Patient denies difficulty with sleep initiation or maintenance. Denies appetite disturbance.  Patient reports that energy and motivation have been good.  Patient denies any difficulty with concentration.  Patient denies any suicidal ideation.  Exercise helps. Works from home.  [name].  Past Psychiatric Medication Trials: Lithium, fluvoxamine, risperidone, lamotrigine 200 mg cognitive side effect, buspirone SE, Ambien, trazodone  2 prior significant manic episodes  .Review of Systems:  Review of Systems  Cardiovascular: Negative for palpitations.  Gastrointestinal: Negative for diarrhea and nausea.  Neurological: Negative for dizziness, tremors and weakness.  Psychiatric/Behavioral: Negative for agitation, behavioral problems, confusion, decreased concentration, dysphoric mood, hallucinations, self-injury, sleep disturbance and suicidal ideas. The patient is not  nervous/anxious and is not hyperactive.     Medications: I have reviewed the patient's current medications.  Current Outpatient Medications  Medication Sig Dispense Refill  . Acetylcysteine 600 MG CAPS Take 1,200 mg by mouth daily.    [name] b complex vitamins tablet Take 1 tablet by mouth daily.    . cholecalciferol (VITAMIN D3) 25 MCG (1000 UT) tablet Take 1,000 Units by mouth daily.    . magnesium 30 MG tablet Take 30 mg by mouth daily.    . Omega-3 Fatty Acids (FISH OIL) 1000 MG CAPS Take by mouth.    . Probiotic Product (PROBIOTIC-10 PO) Take by mouth.    . zolpidem (AMBIEN) 10 MG tablet Take 1/2-1 tablet QHS PRN insomnia (Patient taking differently: Rarely used) 90 tablet 0  . lamoTRIgine (LAMICTAL) 150 MG tablet Take 1 tablet (150 mg total) by mouth daily. 90 tablet 1  . lithium carbonate 300 MG capsule Take 3 capsules (900 mg total) by mouth daily. 270 capsule 1   No current facility-administered medications for this visit.    Medication Side Effects: dry mouth.  No tremor nor diarrhea.  ? Mild dulling or lethargy  Allergies: No Known Allergies  History reviewed. No pertinent past medical history.  History reviewed. No pertinent family history.  Social History   Socioeconomic History  . Marital status: Married    Spouse name: Not on file  . Number of children: Not on file  . Years of education: Not on file  . Highest education level: Not on file  Occupational History  . Not on file  Tobacco Use  . Smoking status: Never Smoker  . Smokeless tobacco: Never Used  Substance and Sexual Activity  . Alcohol use: Not on file  . Drug use: Not on file  . Sexual activity: Not on file  Other Topics Concern  . Not on file  Social History Narrative  . Not on file   Social Determinants of Health   Financial Resource Strain: Not on file  Food Insecurity: Not on file  Transportation Needs: Not on file  Physical Activity: Not on file  Stress: Not on file  Social Connections:  Not on file  Intimate Partner Violence: Not on file    Past Medical History, Surgical history, Social history, and Family history were reviewed and updated as appropriate.   Please see review of systems for further details on the patient's review from today.   Objective:   Physical Exam:  There were no vitals taken for this visit.  Physical Exam Constitutional:      General: He is not in acute distress.    Appearance: He is well-developed.  Musculoskeletal:        General: No deformity.  Neurological:     Mental Status: He is alert and oriented to person, place, and time.     Motor: No tremor.     Coordination: Coordination normal.     Gait: Gait normal.  Psychiatric:        Attention and Perception: He is attentive.        Mood and Affect: Mood is not anxious or depressed. Affect is not labile, blunt, angry, tearful or inappropriate.        Speech: Speech normal. Speech is not rapid and pressured.        Behavior: Behavior normal. Behavior is not slowed.        Thought Content: Thought content normal. Thought content does not include homicidal or suicidal ideation. Thought content does not include homicidal or suicidal plan.        Cognition and Memory: Cognition normal.        Judgment: Judgment normal.     Comments: Insight intact. No auditory or visual hallucinations. No delusions. .      Lab Review:     Component Value Date/Time   NA 138 05/26/2020 0820   K 4.1 05/26/2020 0820   CL 103 05/26/2020 0820   CO2 28 05/26/2020 0820   GLUCOSE 111 05/26/2020 0820   BUN 19 05/26/2020 0820   CREATININE 1.15 05/26/2020 0820   CALCIUM 9.5 05/26/2020 0820    No results found for: WBC, RBC, HGB, HCT, PLT, MCV, MCH, MCHC, RDW, LYMPHSABS, MONOABS, EOSABS, BASOSABS  Lithium Lvl  Date Value Ref Range Status  05/26/2020 0.6 0.6 - 1.2 mmol/L Final     No results found for: PHENYTOIN, PHENOBARB, VALPROATE, CBMZ   .res Assessment: Plan:    Ulus was seen today for  follow-up.  Diagnoses and all orders for this visit:  Bipolar I disorder (HCC) -     lamoTRIgine (LAMICTAL) 150 MG tablet; Take 1 tablet (150 mg total) by mouth  daily. -     lithium carbonate 300 MG capsule; Take 3 capsules (900 mg total) by mouth daily. -     Basic metabolic panel -     Lithium level -     TSH  Generalized anxiety disorder  Lithium use -     Basic metabolic panel -     Lithium level -     TSH  Insomnia due to mental condition    Greater than 50% of 30-minute face to face time with patient and his wife was spent on counseling and coordination of care. We discussed the following: He is not having any mood swings. He feels the best in a year.  However he is cognitively sharper with low-dosel lamotrigine.  Option increase lamotrigine vs lithium.  Disc Se concerns.  Disc pros cons.  Continue lithium to 900 mg daily. Continue lamotrigine 150 mg daily. He knows at higher doses of lamotrigine he does have some mild cognitive side effects of it therefore increasing the lithium may prevent that side effect and resolve his symptoms.  Discussed side effects of each option  Counseled patient regarding potential benefits, risks, and side effects of lithium to include potential risk of lithium affecting thyroid and renal function.  Discussed need for periodic lab monitoring to determine drug level and to assess for potential adverse effects.  Counseled patient regarding signs and symptoms of lithium toxicity and advised that they notify office immediately or seek urgent medical attention if experiencing these signs and symptoms.  Patient advised to contact office with any questions or concerns. Lithium level 0.6  05/2020 Repeat labs before next appt.  He has questions around the value of the other supplements.  I recommended that he not discontinue anything except potentially the fish oil at this point because we want to maximize this because it cognitive abilities. And continue  the NAC.  He is cognitively improved with the NAC and the reduction in lamotrigine.  This appt was 30 mins.  FU 6 mos  Meredith Staggers, MD, DFAPA  Please see After Visit Summary for patient specific instructions.  No future appointments.  Orders Placed This Encounter  Procedures  . Basic metabolic panel  . Lithium level  . TSH      -------------------------------

## 2020-12-08 ENCOUNTER — Other Ambulatory Visit: Payer: Self-pay | Admitting: Psychiatry

## 2020-12-08 DIAGNOSIS — F319 Bipolar disorder, unspecified: Secondary | ICD-10-CM

## 2020-12-08 NOTE — Telephone Encounter (Signed)
Check on refill °

## 2020-12-09 ENCOUNTER — Telehealth: Payer: Self-pay | Admitting: Psychiatry

## 2020-12-09 NOTE — Telephone Encounter (Signed)
This was submitted at 10:40 am today

## 2020-12-09 NOTE — Telephone Encounter (Signed)
Patient called in for refill of Lithum. States he needs refill ASAP. Pharmacy [facility] [address] Jacky Kindle

## 2020-12-10 ENCOUNTER — Telehealth: Payer: Self-pay | Admitting: Psychiatry

## 2020-12-10 NOTE — Telephone Encounter (Signed)
Next visit is 04/29/21. Amdrew wants to know if it would be ok to increase his Lamictal from 150 mg to 175 mg?. His # is [phone].

## 2020-12-10 NOTE — Telephone Encounter (Signed)
Please review

## 2020-12-11 ENCOUNTER — Other Ambulatory Visit: Payer: Self-pay

## 2020-12-11 NOTE — Telephone Encounter (Signed)
Traci,can you please send this one?Since it's not a refill but instead a new Rx I am having trouble on how to send that.I called the Pt and he would like [facility].

## 2020-12-11 NOTE — Telephone Encounter (Signed)
He's very med sensitive.  OK to send in RX for lamotrigine 25 mg 1 daily #90 to add to his 150 mg .  Please send for me

## 2020-12-14 ENCOUNTER — Telehealth: Payer: Self-pay | Admitting: Psychiatry

## 2020-12-14 ENCOUNTER — Other Ambulatory Visit: Payer: Self-pay

## 2020-12-14 MED ORDER — LAMOTRIGINE 25 MG PO TABS: ORAL_TABLET | ORAL | 0 refills | Status: DC

## 2020-12-14 NOTE — Telephone Encounter (Signed)
Pt left a message wanting to know if he can increase his Lamictal from 150mg  to 175mg . Please call.

## 2020-12-14 NOTE — Telephone Encounter (Signed)
Rx for 25 mg Lamictal sent today. Did not see message until this morning

## 2020-12-14 NOTE — Telephone Encounter (Signed)
Let him know the prescription for 25 mg lamotrigine was sent

## 2020-12-14 NOTE — Telephone Encounter (Signed)
Pt has been informed.

## 2021-02-01 ENCOUNTER — Telehealth: Payer: Self-pay | Admitting: Psychiatry

## 2021-02-01 NOTE — Telephone Encounter (Signed)
Pt wants to know what Dr. Jennelle Human thinks about him increasing the Lamotrigine  is taking from 175mg  to 200mg . Plese call [phone].

## 2021-02-01 NOTE — Telephone Encounter (Signed)
Please review

## 2021-02-01 NOTE — Telephone Encounter (Signed)
It is fine with meeting for him to increase lamotrigine from 175 to 200 mg daily.

## 2021-02-02 ENCOUNTER — Other Ambulatory Visit: Payer: Self-pay | Admitting: Orthopedic Surgery

## 2021-02-02 DIAGNOSIS — M25561 Pain in right knee: Secondary | ICD-10-CM

## 2021-02-02 NOTE — Telephone Encounter (Signed)
Pt informed

## 2021-02-04 ENCOUNTER — Other Ambulatory Visit: Payer: Self-pay

## 2021-02-04 ENCOUNTER — Ambulatory Visit
Admission: RE | Admit: 2021-02-04 | Discharge: 2021-02-04 | Disposition: A | Discharge status: Home / Self Care | Payer: Managed Care, Other (non HMO) | Source: Ambulatory Visit | Attending: Orthopedic Surgery | Admitting: Orthopedic Surgery

## 2021-02-04 DIAGNOSIS — M25561 Pain in right knee: Secondary | ICD-10-CM

## 2021-02-06 ENCOUNTER — Other Ambulatory Visit: Payer: Self-pay

## 2021-02-13 ENCOUNTER — Telehealth: Payer: Self-pay | Admitting: Psychiatry

## 2021-02-13 DIAGNOSIS — F319 Bipolar disorder, unspecified: Secondary | ICD-10-CM

## 2021-02-15 ENCOUNTER — Other Ambulatory Visit: Payer: Self-pay

## 2021-02-15 DIAGNOSIS — F319 Bipolar disorder, unspecified: Secondary | ICD-10-CM

## 2021-02-15 MED ORDER — LAMOTRIGINE 200 MG PO TABS: 200.0000 mg | ORAL_TABLET | Freq: Every day | ORAL | 0 refills | Status: DC

## 2021-02-15 NOTE — Telephone Encounter (Signed)
Pt also, requesting new Rx for Lamotrigine 200 mg. Now taking 150 mg & 25 mg. Requesting to combine the two doses. He ask for 200 mg. Apt 6/28

## 2021-02-15 NOTE — Telephone Encounter (Signed)
Thanks [employer] sent

## 2021-02-15 NOTE — Telephone Encounter (Addendum)
Please review previous message from CC stating ok to change from 175 mg to 200 mg on Lamotrigine. On 5/2 phone message.  Thanks!

## 2021-02-15 NOTE — Telephone Encounter (Signed)
Please see above message about lamotrigine and advise

## 2021-02-17 ENCOUNTER — Other Ambulatory Visit: Payer: Managed Care, Other (non HMO)

## 2021-03-09 ENCOUNTER — Other Ambulatory Visit: Payer: Self-pay | Admitting: Psychiatry

## 2021-03-30 ENCOUNTER — Encounter: Payer: Self-pay | Admitting: Psychiatry

## 2021-03-30 ENCOUNTER — Ambulatory Visit (INDEPENDENT_AMBULATORY_CARE_PROVIDER_SITE_OTHER): Payer: 59 | Admitting: Psychiatry

## 2021-03-30 ENCOUNTER — Other Ambulatory Visit: Payer: Self-pay

## 2021-03-30 DIAGNOSIS — Z79899 Other long term (current) drug therapy: Secondary | ICD-10-CM | POA: Diagnosis not present

## 2021-03-30 DIAGNOSIS — F411 Generalized anxiety disorder: Secondary | ICD-10-CM | POA: Diagnosis not present

## 2021-03-30 DIAGNOSIS — F5105 Insomnia due to other mental disorder: Secondary | ICD-10-CM | POA: Diagnosis not present

## 2021-03-30 DIAGNOSIS — F319 Bipolar disorder, unspecified: Secondary | ICD-10-CM

## 2021-03-30 MED ORDER — LITHIUM CARBONATE 300 MG PO CAPS: 900.0000 mg | ORAL_CAPSULE | Freq: Every day | ORAL | 1 refills | Status: DC

## 2021-03-30 MED ORDER — LAMOTRIGINE 200 MG PO TABS
200.0000 mg | ORAL_TABLET | Freq: Every day | ORAL | 1 refills | Status: DC
Start: 2021-03-30 — End: 2021-09-21

## 2021-03-30 NOTE — Progress Notes (Signed)
Julian BonReaves Fisch [id number] 1971-02-23 50 y.o.  Subjective:   Patient ID:  Julian [name] is a 50 y.o. (DOB 1971-02-23) male.  Chief Complaint:  Chief Complaint  Patient presents with   Follow-up   Bipolar I disorder (HCC)    HPI Julian [name] presents to the office today for follow-up of adding lithium and reducing lamotrigine.  At the visit in June we increased lithium to 750 mg daily because of a low lithium level and started tapering lamotrigine in hopes that he could be a lithium mono responder.  He was also scheduled to get a lithium level.  At the visit May 16, 2019 the following change was made: Last lithium level was 0.5 which is still at the low end of the normal range. Due to low lithium level rec increase into the normal range by increasing lithium 900 mg daily . After 2 weeks of 50 mg lamotrigine Then stop lamotrigine.  At visit November started buspirone. Weaning off lamotrigine was hard.  Felt bad, tingling fingers, etc.  Lasted a couple of weeks.  November 24 called and wanted to restart lamotrigine at 75 mg today.  Noticed cognitive SE at higher doses.  [name] noticed the benefit too, more positive and relaxed.  Last seen December 2020.  He stated the following and no meds were changed.  I feel like this is the right combination.  Even noticed restarting 25 mg of lamotrigine he saw benefit. Better cognition with less also of the lamotrigine.  I like the stability I have right now.   As of December 31, 2019:  Seen with [name] Called 12/03/19 called and wanted to increase lamotrigine to 100 bc mixed anxiety and depression.  He thinks the right amount is 150 mg daily.  Saw benefit substantially with the increase.  Still not quite where he wants to be.  Stressful bc changing jobs to new company.  Anxiety and depression is still abnormal.   W noted SOB, poor sleep and a lot of anxiety.  Still a little depressed and irritable. No sig cognitive problems. Plan:  He is not  having any mood swings.  However he is more anxious and a little depressed with low dose of lamotrigine.  However he is cognitively sharper with low-dosel lamotrigine.  Option increase lamotrigine vs lithium.  Disc Se concerns.  Disc pros cons.  Increase lithium to 1200 mg daily. If this fails to improve his residual symptoms of depression and anxiety then we will reduced lithium back to 900 mg daily and increase lamotrigine to either 125 or 150 mg daily. He knows at higher doses of lamotrigine he does have some mild cognitive side effects of it therefore increasing the lithium may prevent that side effect and resolve his symptoms.  January 29, 2020 appointment the following is noted: Took lithium 1200 mg for 9 days bc it didn't helpt the irritability enough or depression.  So increased lamotrigine to 150 mg daily and dropped lithium to 900 mg for the last few weeks.  Been really good since.   This is the best I've felt in a long time.  Tolerated cognitively well.   Still believes the lithium is a wonder drug.  Very stable.  Patient reports stable mood, and less depressed or irritable moods.  Without lamotrigine he has more anxiety and irritability.  Still is more intense and harder to relax than it used to be but mild.  Positive stress of new job and gets up early to go work  out.  Julian [name] sleeper.  Patient denies difficulty with sleep initiation or maintenance, but works hard to get enough sleep.  Usually taking Ambien lately and not trazodone.  Sleep fine with it.  Denies appetite disturbance.  Patient reports that energy and motivation have been good.  Patient denies any difficulty with concentration.  Patient denies any suicidal ideation. Lately working hard and more tired the last few days. Option increase lamotrigine vs lithium.  Disc Se concerns.  Disc pros cons.  Continue lithium to 900 mg daily. Continue lamotrigine 150 mg daily.  04/29/20 appt with the following noted: Taking NAC longterm. Really  pleased where I am cognitively, mood and anxiety. Pleased with doses and SE. Patient reports stable mood and denies depressed or irritable moods.  Patient denies any recent difficulty with anxiety.  Patient denies difficulty with sleep initiation or maintenance but chronic worry over sleep. Ambien rarely and trazodone more rare.  Denies appetite disturbance.  Patient reports that energy and motivation have been good.  Patient denies any difficulty with concentration.  Patient denies any suicidal ideation. Recognizes at 200 lamotrigine had sig cog problems.  Wonders about lithium delaying response time. Plan no changes  10/29/20 appt noted: Still doing well.  Wants to have more intensity and engagement.  But happy and content and productive.  Lost a little edge for pushing but it's not enough to make a change.  No concerns from wife.   Sleep great with melatonin 5 mg.  Ambien 2 monthly.  Not a big fan of trazodone. Patient reports stable mood and denies depressed or irritable moods.  Patient denies any recent difficulty with anxiety.  Patient denies difficulty with sleep initiation or maintenance. Denies appetite disturbance.  Patient reports that energy and motivation have been good.  Patient denies any difficulty with concentration.  Patient denies any suicidal ideation. Plan: No med change  12/10/2020 phone call patient wanted to increase lamotrigine from 150 to 175 mg daily.  Okay. 02/01/2021 phone call wanting to increase lamotrigine from 175 mg to 200 mg daily.  Agreed.  03/30/2021 appointment with the following noted: Great now.  Feels he's better witn the increase lamotrigine to 200 mg daily.  Needs lithium and lamotrigine. No cognitive problems with the lamotrigine now.  Disproved by changing doses.  Satisfied. Patient reports stable mood and denies depressed or irritable moods.  Patient denies any recent difficulty with anxiety.  Patient denies difficulty with sleep initiation or  maintenance. Denies appetite disturbance.  Patient reports that energy and motivation have been good.  Patient denies any difficulty with concentration.  Patient denies any suicidal ideation. Tolerating lithium well. Dentist checked him for OSA-  trying retainer despite scoring in the mild range of OSA. New doc Dr. Eric Form [facility]  Exercise helps. Works from home.  [name].  Past Psychiatric Medication Trials: Lithium, fluvoxamine, risperidone, lamotrigine 200 mg cognitive side effect, buspirone SE, Ambien, trazodone  2 prior significant manic episodes  .Review of Systems:  Review of Systems  Cardiovascular:  Negative for palpitations.  Gastrointestinal:  Negative for diarrhea and nausea.  Neurological:  Negative for dizziness, tremors and weakness.  Psychiatric/Behavioral:  Negative for agitation, behavioral problems, confusion, decreased concentration, dysphoric mood, hallucinations, self-injury, sleep disturbance and suicidal ideas. The patient is not nervous/anxious and is not hyperactive.    Medications: I have reviewed the patient's current medications.  Current Outpatient Medications  Medication Sig Dispense Refill   Acetylcysteine 600 MG CAPS Take 1,200 mg by mouth daily.  b complex vitamins tablet Take 1 tablet by mouth daily.     cholecalciferol (VITAMIN D3) 25 MCG (1000 UT) tablet Take 1,000 Units by mouth daily.     magnesium 30 MG tablet Take 30 mg by mouth daily.     Omega-3 Fatty Acids (FISH OIL) 1000 MG CAPS Take by mouth.     Probiotic Product (PROBIOTIC-10 PO) Take by mouth.     zolpidem (AMBIEN) 10 MG tablet Take 1/2-1 tablet QHS PRN insomnia (Patient taking differently: Rarely used) 90 tablet 0   lamoTRIgine (LAMICTAL) 200 MG tablet Take 1 tablet (200 mg total) by mouth daily. 90 tablet 1   lithium carbonate 300 MG capsule Take 3 capsules (900 mg total) by mouth daily. 270 capsule 1   No current facility-administered medications for  this visit.    Medication Side Effects: dry mouth.  No tremor nor diarrhea.  ? Mild dulling or lethargy  Allergies: No Known Allergies  History reviewed. No pertinent past medical history.  History reviewed. No pertinent family history.  Social History   Socioeconomic History   Marital status: Married    Spouse name: Not on file   Number of children: Not on file   Years of education: Not on file   Highest education level: Not on file  Occupational History   Not on file  Tobacco Use   Smoking status: Never   Smokeless tobacco: Never  Substance and Sexual Activity   Alcohol use: Not on file   Drug use: Not on file   Sexual activity: Not on file  Other Topics Concern   Not on file  Social History Narrative   Not on file   Social Determinants of Health   Financial Resource Strain: Not on file  Food Insecurity: Not on file  Transportation Needs: Not on file  Physical Activity: Not on file  Stress: Not on file  Social Connections: Not on file  Intimate Partner Violence: Not on file    Past Medical History, Surgical history, Social history, and Family history were reviewed and updated as appropriate.   Please see review of systems for further details on the patient's review from today.   Objective:   Physical Exam:  There were no vitals taken for this visit.  Physical Exam Constitutional:      General: He is not in acute distress. Musculoskeletal:        General: No deformity.  Neurological:     Mental Status: He is alert and oriented to person, place, and time.     Coordination: Coordination normal.     Gait: Gait normal.  Psychiatric:        Attention and Perception: He is attentive.        Mood and Affect: Mood is not anxious or depressed. Affect is not labile, blunt, angry, tearful or inappropriate.        Speech: Speech normal. Speech is not rapid and pressured or slurred.        Behavior: Behavior normal. Behavior is not slowed.        Thought  Content: Thought content normal. Thought content does not include homicidal or suicidal ideation. Thought content does not include homicidal or suicidal plan.        Cognition and Memory: Cognition normal.        Judgment: Judgment normal.     Comments: Insight intact. No auditory or visual hallucinations. No delusions. .     Lab Review:     Component Value Date/Time  NA 138 05/26/2020 0820   K 4.1 05/26/2020 0820   CL 103 05/26/2020 0820   CO2 28 05/26/2020 0820   GLUCOSE 111 05/26/2020 0820   BUN 19 05/26/2020 0820   CREATININE 1.15 05/26/2020 0820   CALCIUM 9.5 05/26/2020 0820    No results found for: WBC, RBC, HGB, HCT, PLT, MCV, MCH, MCHC, RDW, LYMPHSABS, MONOABS, EOSABS, BASOSABS  Lithium Lvl  Date Value Ref Range Status  05/26/2020 0.6 0.6 - 1.2 mmol/L Final     No results found for: PHENYTOIN, PHENOBARB, VALPROATE, CBMZ   .res Assessment: Plan:    Julian [name] was seen today for follow-up and bipolar i disorder (hcc).  Diagnoses and all orders for this visit:  Bipolar I disorder (HCC) -     Lithium level -     TSH -     Basic metabolic panel -     lithium carbonate 300 MG capsule; Take 3 capsules (900 mg total) by mouth daily. -     lamoTRIgine (LAMICTAL) 200 MG tablet; Take 1 tablet (200 mg total) by mouth daily.  Generalized anxiety disorder  Insomnia due to mental condition  Lithium use -     Lithium level -     TSH -     Basic metabolic panel   Greater than 02% of 30-minute face to face time with patient and his wife was spent on counseling and coordination of care. We discussed the following: He is not having any mood swings. He feels the best in a year.  He feels his mood is better on lamotrigine 200 mg daily and there are no significant cognitive side effects at this dose.  Option increase lamotrigine vs lithium.  Disc Se concerns.  Disc pros cons.  Continue lithium to 900 mg daily. Continue lamotrigine 200 mg daily. He knows higher doses of  lamotrigine can cause mild cognitive side effects   Counseled patient regarding potential benefits, risks, and side effects of lithium to include potential risk of lithium affecting thyroid and renal function.  Discussed need for periodic lab monitoring to determine drug level and to assess for potential adverse effects.  Counseled patient regarding signs and symptoms of lithium toxicity and advised that they notify office immediately or seek urgent medical attention if experiencing these signs and symptoms.  Patient advised to contact office with any questions or concerns. Lithium level 0.6  05/2020 Repeat labs ASAP.  He plans to at PCP  He has questions around the value of the other supplements.   And continue the NAC.  He is cognitively improved with the NAC.  This appt was 30 mins.  FU 6 mos  Meredith Staggers, MD, DFAPA  Please see After Visit Summary for patient specific instructions.  No future appointments.  Orders Placed This Encounter  Procedures   Lithium level   TSH   Basic metabolic panel       -------------------------------

## 2021-04-29 ENCOUNTER — Ambulatory Visit: Payer: 59 | Admitting: Psychiatry

## 2021-05-11 ENCOUNTER — Other Ambulatory Visit: Payer: Self-pay | Admitting: Psychiatry

## 2021-05-11 DIAGNOSIS — F319 Bipolar disorder, unspecified: Secondary | ICD-10-CM

## 2021-09-21 ENCOUNTER — Other Ambulatory Visit: Payer: Self-pay

## 2021-09-21 ENCOUNTER — Ambulatory Visit (INDEPENDENT_AMBULATORY_CARE_PROVIDER_SITE_OTHER): Payer: 59 | Admitting: Psychiatry

## 2021-09-21 ENCOUNTER — Encounter: Payer: Self-pay | Admitting: Psychiatry

## 2021-09-21 DIAGNOSIS — F5105 Insomnia due to other mental disorder: Secondary | ICD-10-CM | POA: Diagnosis not present

## 2021-09-21 DIAGNOSIS — F319 Bipolar disorder, unspecified: Secondary | ICD-10-CM

## 2021-09-21 DIAGNOSIS — Z79899 Other long term (current) drug therapy: Secondary | ICD-10-CM

## 2021-09-21 DIAGNOSIS — F411 Generalized anxiety disorder: Secondary | ICD-10-CM

## 2021-09-21 MED ORDER — LITHIUM CARBONATE 300 MG PO CAPS: 900.0000 mg | ORAL_CAPSULE | Freq: Every day | ORAL | 1 refills | Status: DC

## 2021-09-21 MED ORDER — LAMOTRIGINE 200 MG PO TABS
200.0000 mg | ORAL_TABLET | Freq: Every day | ORAL | 1 refills | Status: DC
Start: 2021-09-21 — End: 2022-03-22

## 2021-09-21 NOTE — Patient Instructions (Signed)
Meloxicam is better option than routine ibuprofen

## 2021-09-21 NOTE — Progress Notes (Signed)
Julian [name] [id number] 1971/09/13 50 y.o.  Subjective:   Patient ID:  Julian [name] is a 50 y.o. (DOB 12-01-1970) male.  Chief Complaint:  Chief Complaint  Patient presents with   Follow-up    Bipolar I disorder (HCC)    HPI Julian [name] presents to the office today for follow-up of adding lithium and reducing lamotrigine.  At the visit in June we increased lithium to 750 mg daily because of a low lithium level and started tapering lamotrigine in hopes that he could be a lithium mono responder.  He was also scheduled to get a lithium level.  At the visit May 16, 2019 the following change was made: Last lithium level was 0.5 which is still at the low end of the normal range. Due to low lithium level rec increase into the normal range by increasing lithium 900 mg daily . After 2 weeks of 50 mg lamotrigine Then stop lamotrigine.  At visit November started buspirone. Weaning off lamotrigine was hard.  Felt bad, tingling fingers, etc.  Lasted a couple of weeks.  November 24 called and wanted to restart lamotrigine at 75 mg today.  Noticed cognitive SE at higher doses.  Wife [name] noticed the benefit too, more positive and relaxed.   seen December 2020.  He stated the following and no meds were changed.  I feel like this is the right combination.  Even noticed restarting 25 mg of lamotrigine he saw benefit. Better cognition with less also of the lamotrigine.  I like the stability I have right now.   As of December 31, 2019:  Seen with wife [name] 12/03/19 called and wanted to increase lamotrigine to 100 bc mixed anxiety and depression.  He thinks the right amount is 150 mg daily.  Saw benefit substantially with the increase.  Still not quite where he wants to be.  Stressful bc changing jobs to new company.  Anxiety and depression is still abnormal.   W noted SOB, poor sleep and a lot of anxiety.  Still a little depressed and irritable. No sig cognitive problems. Plan:  He is not having  any mood swings.  However he is more anxious and a little depressed with low dose of lamotrigine.  However he is cognitively sharper with low-dosel lamotrigine.  Option increase lamotrigine vs lithium.  Disc Se concerns.  Disc pros cons.  Increase lithium to 1200 mg daily. If this fails to improve his residual symptoms of depression and anxiety then we will reduced lithium back to 900 mg daily and increase lamotrigine to either 125 or 150 mg daily. He knows at higher doses of lamotrigine he does have some mild cognitive side effects of it therefore increasing the lithium may prevent that side effect and resolve his symptoms.  January 29, 2020 appointment the following is noted: Took lithium 1200 mg for 9 days bc it didn't helpt the irritability enough or depression.  So increased lamotrigine to 150 mg daily and dropped lithium to 900 mg for the last few weeks.  Been really good since.   This is the best I've felt in a long time.  Tolerated cognitively well.   Still believes the lithium is a wonder drug.  Very stable.  Patient reports stable mood, and less depressed or irritable moods.  Without lamotrigine he has more anxiety and irritability.  Still is more intense and harder to relax than it used to be but mild.  Positive stress of new job and gets up early to go  work out.  Light sleeper.  Patient denies difficulty with sleep initiation or maintenance, but works hard to get enough sleep.  Usually taking Ambien lately and not trazodone.  Sleep fine with it.  Denies appetite disturbance.  Patient reports that energy and motivation have been good.  Patient denies any difficulty with concentration.  Patient denies any suicidal ideation. Lately working hard and more tired the last few days. Option increase lamotrigine vs lithium.  Disc Se concerns.  Disc pros cons.  Continue lithium to 900 mg daily. Continue lamotrigine 150 mg daily.  04/29/20 appt with the following noted: Taking NAC longterm. Really  pleased where I am cognitively, mood and anxiety. Pleased with doses and SE. Patient reports stable mood and denies depressed or irritable moods.  Patient denies any recent difficulty with anxiety.  Patient denies difficulty with sleep initiation or maintenance but chronic worry over sleep. Ambien rarely and trazodone more rare.  Denies appetite disturbance.  Patient reports that energy and motivation have been good.  Patient denies any difficulty with concentration.  Patient denies any suicidal ideation. Recognizes at 200 lamotrigine had sig cog problems.  Wonders about lithium delaying response time. Plan no changes  10/29/20 appt noted: Still doing well.  Wants to have more intensity and engagement.  But happy and content and productive.  Lost a little edge for pushing but it's not enough to make a change.  No concerns from wife.   Sleep great with melatonin 5 mg.  Ambien 2 monthly.  Not a big fan of trazodone. Patient reports stable mood and denies depressed or irritable moods.  Patient denies any recent difficulty with anxiety.  Patient denies difficulty with sleep initiation or maintenance. Denies appetite disturbance.  Patient reports that energy and motivation have been good.  Patient denies any difficulty with concentration.  Patient denies any suicidal ideation. Plan: No med change  12/10/2020 phone call patient wanted to increase lamotrigine from 150 to 175 mg daily.  Okay. 02/01/2021 phone call wanting to increase lamotrigine from 175 mg to 200 mg daily.  Agreed.  03/30/2021 appointment with the following noted: Great now.  Feels he's better witn the increase lamotrigine to 200 mg daily.  Needs lithium and lamotrigine. No cognitive problems with the lamotrigine now.  Disproved by changing doses.  Satisfied. Patient reports stable mood and denies depressed or irritable moods.  Patient denies any recent difficulty with anxiety.  Patient denies difficulty with sleep initiation or  maintenance. Denies appetite disturbance.  Patient reports that energy and motivation have been good.  Patient denies any difficulty with concentration.  Patient denies any suicidal ideation. Tolerating lithium well. Dentist checked him for OSA-  trying retainer despite scoring in the mild range of OSA. New doc Dr. Eric Form [facility] Plan continue lithium 900 and lamotrigine 200 mg daily and get lithium labs  09/21/2021 appt noted: Overall pretty well.  Over last 5 years or so, increased anxiety.  It is intermittently difficult to deal with.  Persistent nagging anxiety for a period of time.  Can interfere with problem solving and seeing the big picture. Got mouthpiece for OSA and sleep quality is much better with better alertness.   Patient reports stable mood and denies depressed or irritable moods.   Patient denies difficulty with sleep initiation or maintenance. Denies appetite disturbance.  Patient reports that energy and motivation have been good.  Patient denies any difficulty with concentration.  Patient denies any suicidal ideation. Cycles of anxiety a week out  of the month.  Exercise helps. Works from home.  Wife [name].  Past Psychiatric Medication Trials: Lithium, fluvoxamine, risperidone, lamotrigine 200 mg cognitive side effect, buspirone SE, Ambien, trazodone  2 prior significant manic episodes  .Review of Systems:  Review of Systems  Gastrointestinal:  Negative for diarrhea and nausea.  Neurological:  Negative for dizziness, tremors and weakness.  Psychiatric/Behavioral:  Negative for agitation, behavioral problems, confusion, decreased concentration, dysphoric mood, hallucinations, self-injury, sleep disturbance and suicidal ideas. The patient is not nervous/anxious and is not hyperactive.    Medications: I have reviewed the patient's current medications.  Current Outpatient Medications  Medication Sig Dispense Refill   Acetylcysteine 600 MG CAPS Take  1,200 mg by mouth daily.     b complex vitamins tablet Take 1 tablet by mouth daily.     cholecalciferol (VITAMIN D3) 25 MCG (1000 UT) tablet Take 1,000 Units by mouth daily.     magnesium 30 MG tablet Take 30 mg by mouth daily.     Omega-3 Fatty Acids (FISH OIL) 1000 MG CAPS Take by mouth.     Probiotic Product (PROBIOTIC-10 PO) Take by mouth.     zolpidem (AMBIEN) 10 MG tablet Take 1/2-1 tablet QHS PRN insomnia (Patient taking differently: Rarely used) 90 tablet 0   lamoTRIgine (LAMICTAL) 200 MG tablet Take 1 tablet (200 mg total) by mouth daily. 90 tablet 1   lithium carbonate 300 MG capsule Take 3 capsules (900 mg total) by mouth daily. 270 capsule 1   No current facility-administered medications for this visit.    Medication Side Effects: dry mouth.  No tremor nor diarrhea.  ? Mild dulling or lethargy  Allergies: No Known Allergies  History reviewed. No pertinent past medical history.  History reviewed. No pertinent family history.  Social History   Socioeconomic History   Marital status: Married    Spouse name: Not on file   Number of children: Not on file   Years of education: Not on file   Highest education level: Not on file  Occupational History   Not on file  Tobacco Use   Smoking status: Never   Smokeless tobacco: Never  Substance and Sexual Activity   Alcohol use: Not on file   Drug use: Not on file   Sexual activity: Not on file  Other Topics Concern   Not on file  Social History Narrative   Not on file   Social Determinants of Health   Financial Resource Strain: Not on file  Food Insecurity: Not on file  Transportation Needs: Not on file  Physical Activity: Not on file  Stress: Not on file  Social Connections: Not on file  Intimate Partner Violence: Not on file    Past Medical History, Surgical history, Social history, and Family history were reviewed and updated as appropriate.   Please see review of systems for further details on the patient's  review from today.   Objective:   Physical Exam:  There were no vitals taken for this visit.  Physical Exam Constitutional:      General: He is not in acute distress. Musculoskeletal:        General: No deformity.  Neurological:     Mental Status: He is alert and oriented to person, place, and time.     Coordination: Coordination normal.     Gait: Gait normal.  Psychiatric:        Attention and Perception: He is attentive.        Mood and Affect: Mood  is not anxious or depressed. Affect is not labile, blunt, angry, tearful or inappropriate.        Speech: Speech normal. Speech is not rapid and pressured or slurred.        Behavior: Behavior normal. Behavior is not slowed.        Thought Content: Thought content normal. Thought content is not delusional. Thought content does not include homicidal or suicidal ideation. Thought content does not include suicidal plan.        Cognition and Memory: Cognition normal.        Judgment: Judgment normal.     Comments: Insight intact. No auditory or visual hallucinations. No delusions. .     Lab Review:     Component Value Date/Time   NA 138 05/26/2020 0820   K 4.1 05/26/2020 0820   CL 103 05/26/2020 0820   CO2 28 05/26/2020 0820   GLUCOSE 111 05/26/2020 0820   BUN 19 05/26/2020 0820   CREATININE 1.15 05/26/2020 0820   CALCIUM 9.5 05/26/2020 0820    No results found for: WBC, RBC, HGB, HCT, PLT, MCV, MCH, MCHC, RDW, LYMPHSABS, MONOABS, EOSABS, BASOSABS  Lithium Lvl  Date Value Ref Range Status  05/26/2020 0.6 0.6 - 1.2 mmol/L Final     No results found for: PHENYTOIN, PHENOBARB, VALPROATE, CBMZ   Dr. Clelia Croft 08/03/2021 lithium level 0.8 creatinine 1.1 calcium 8.4, TSH 3.6  .res Assessment: Plan:    Lannis was seen today for follow-up.  Diagnoses and all orders for this visit:  Bipolar I disorder (HCC) -     lamoTRIgine (LAMICTAL) 200 MG tablet; Take 1 tablet (200 mg total) by mouth daily. -     lithium carbonate 300 MG  capsule; Take 3 capsules (900 mg total) by mouth daily.  Generalized anxiety disorder  Insomnia due to mental condition  Lithium use    Greater than 50% of 30-minute face to face time with patient and his wife was spent on counseling and coordination of care. We discussed the following: He is not having any mood swings. He feels the best in a year.  He feels his mood is better on lamotrigine 200 mg daily and there are no significant cognitive side effects at this dose.  Option retry buspirone vs BZ discussed. For residual anxiety.  He'd like to try buspirone again.  Disc how to manage dosing and prior nausea problems with buspirone.  Option increase lamotrigine vs lithium.  Disc Se concerns.  Disc pros cons.  Continue lithium to 900 mg daily. Continue lamotrigine 200 mg daily. He knows higher doses of lamotrigine can cause mild cognitive side effects   Counseled patient regarding potential benefits, risks, and side effects of lithium to include potential risk of lithium affecting thyroid and renal function.  Discussed need for periodic lab monitoring to determine drug level and to assess for potential adverse effects.  Counseled patient regarding signs and symptoms of lithium toxicity and advised that they notify office immediately or seek urgent medical attention if experiencing these signs and symptoms.  Patient advised to contact office with any questions or concerns.   Disc example of lithium toxicity and dehydration and DDI. Labs good. Repeat lithium prior to next appt.  He has questions around the value of the other supplements.   And continue the NAC.  He is cognitively improved with the NAC.  This appt was 30 mins.  FU 6 mos  Meredith Staggers, MD, DFAPA  Please see After Visit Summary for patient specific  instructions.  No future appointments.  No orders of the defined types were placed in this encounter.      -------------------------------

## 2022-03-22 ENCOUNTER — Encounter: Payer: Self-pay | Admitting: Psychiatry

## 2022-03-22 ENCOUNTER — Ambulatory Visit (INDEPENDENT_AMBULATORY_CARE_PROVIDER_SITE_OTHER): Payer: 59 | Admitting: Psychiatry

## 2022-03-22 ENCOUNTER — Other Ambulatory Visit: Payer: Self-pay | Admitting: Psychiatry

## 2022-03-22 DIAGNOSIS — F319 Bipolar disorder, unspecified: Secondary | ICD-10-CM

## 2022-03-22 DIAGNOSIS — F5105 Insomnia due to other mental disorder: Secondary | ICD-10-CM

## 2022-03-22 DIAGNOSIS — Z79899 Other long term (current) drug therapy: Secondary | ICD-10-CM

## 2022-03-22 DIAGNOSIS — F411 Generalized anxiety disorder: Secondary | ICD-10-CM

## 2022-03-22 MED ORDER — LAMOTRIGINE 200 MG PO TABS: 200.0000 mg | ORAL_TABLET | Freq: Every day | ORAL | 1 refills | Status: DC

## 2022-03-22 MED ORDER — LITHIUM CARBONATE 300 MG PO CAPS: 900.0000 mg | ORAL_CAPSULE | Freq: Every day | ORAL | 1 refills | Status: DC

## 2022-03-22 NOTE — Progress Notes (Signed)
Julian [name] [id number] 05-13-1971 51 y.o.  Subjective:   Patient ID:  Julian [name] is a 51 y.o. (DOB 24-Apr-1971) male.  Chief Complaint:  Chief Complaint  Patient presents with   Follow-up    Bipolar I disorder (HCC)   Anxiety    HPI Julian [name] presents to the office today for follow-up of adding lithium and reducing lamotrigine.  At the visit in June we increased lithium to 750 mg daily because of a low lithium level and started tapering lamotrigine in hopes that he could be a lithium mono responder.  He was also scheduled to get a lithium level.  At the visit May 16, 2019 the following change was made: Last lithium level was 0.5 which is still at the low end of the normal range. Due to low lithium level rec increase into the normal range by increasing lithium 900 mg daily . After 2 weeks of 50 mg lamotrigine Then stop lamotrigine.  At visit November started buspirone. Weaning off lamotrigine was hard.  Felt bad, tingling fingers, etc.  Lasted a couple of weeks.  November 24 called and wanted to restart lamotrigine at 75 mg today.  Noticed cognitive SE at higher doses.  [name] noticed the benefit too, more positive and relaxed.   seen December 2020.  He stated the following and no meds were changed.  I feel like this is the right combination.  Even noticed restarting 25 mg of lamotrigine he saw benefit. Better cognition with less also of the lamotrigine.  I like the stability I have right now.   As of [name] 30, 2021:  Seen with [name] 12/03/19 called and wanted to increase lamotrigine to 100 bc mixed anxiety and depression.  He thinks the right amount is 150 mg daily.  Saw benefit substantially with the increase.  Still not quite where he wants to be.  Stressful bc changing jobs to new company.  Anxiety and depression is still abnormal.   W noted SOB, poor sleep and a lot of anxiety.  Still a little depressed and irritable. No sig cognitive problems. Plan:  He is  not having any mood swings.  However he is more anxious and a little depressed with low dose of lamotrigine.  However he is cognitively sharper with low-dosel lamotrigine.  Option increase lamotrigine vs lithium.  Disc Se concerns.  Disc pros cons.  Increase lithium to 1200 mg daily. If this fails to improve his residual symptoms of depression and anxiety then we will reduced lithium back to 900 mg daily and increase lamotrigine to either 125 or 150 mg daily. He knows at higher doses of lamotrigine he does have some mild cognitive side effects of it therefore increasing the lithium may prevent that side effect and resolve his symptoms.  January 29, 2020 appointment the following is noted: Took lithium 1200 mg for 9 days bc it didn't helpt the irritability enough or depression.  So increased lamotrigine to 150 mg daily and dropped lithium to 900 mg for the last few weeks.  Been really good since.   This is the best I've felt in a long time.  Tolerated cognitively well.   Still believes the lithium is a wonder drug.  Very stable.  Patient reports stable mood, and less depressed or irritable moods.  Without lamotrigine he has more anxiety and irritability.  Still is more intense and harder to relax than it used to be but mild.  Positive stress of new job and gets up  early to go work out.  Light sleeper.  Patient denies difficulty with sleep initiation or maintenance, but works hard to get enough sleep.  Usually taking Ambien lately and not trazodone.  Sleep fine with it.  Denies appetite disturbance.  Patient reports that energy and motivation have been good.  Patient denies any difficulty with concentration.  Patient denies any suicidal ideation. Lately working hard and more tired the last few days. Option increase lamotrigine vs lithium.  Disc Se concerns.  Disc pros cons.  Continue lithium to 900 mg daily. Continue lamotrigine 150 mg daily.  04/29/20 appt with the following noted: Taking NAC  longterm. Really pleased where I am cognitively, mood and anxiety. Pleased with doses and SE. Patient reports stable mood and denies depressed or irritable moods.  Patient denies any recent difficulty with anxiety.  Patient denies difficulty with sleep initiation or maintenance but chronic worry over sleep. Ambien rarely and trazodone more rare.  Denies appetite disturbance.  Patient reports that energy and motivation have been good.  Patient denies any difficulty with concentration.  Patient denies any suicidal ideation. Recognizes at 200 lamotrigine had sig cog problems.  Wonders about lithium delaying response time. Plan no changes  10/29/20 appt noted: Still doing well.  Wants to have more intensity and engagement.  But happy and content and productive.  Lost a little edge for pushing but it's not enough to make a change.  No concerns from wife.   Sleep great with melatonin 5 mg.  Ambien 2 monthly.  Not a big fan of trazodone. Patient reports stable mood and denies depressed or irritable moods.  Patient denies any recent difficulty with anxiety.  Patient denies difficulty with sleep initiation or maintenance. Denies appetite disturbance.  Patient reports that energy and motivation have been good.  Patient denies any difficulty with concentration.  Patient denies any suicidal ideation. Plan: No med change  12/10/2020 phone call patient wanted to increase lamotrigine from 150 to 175 mg daily.  Okay. 02/01/2021 phone call wanting to increase lamotrigine from 175 mg to 200 mg daily.  Agreed.  03/30/2021 appointment with the following noted: Great now.  Feels he's better witn the increase lamotrigine to 200 mg daily.  Needs lithium and lamotrigine. No cognitive problems with the lamotrigine now.  Disproved by changing doses.  Satisfied. Patient reports stable mood and denies depressed or irritable moods.  Patient denies any recent difficulty with anxiety.  Patient denies difficulty with sleep  initiation or maintenance. Denies appetite disturbance.  Patient reports that energy and motivation have been good.  Patient denies any difficulty with concentration.  Patient denies any suicidal ideation. Tolerating lithium well. Dentist checked him for OSA-  trying retainer despite scoring in the mild range of OSA. New doc Dr. Eric [facility] Plan continue lithium 900 and lamotrigine 200 mg daily and get lithium labs  09/21/2021 appt noted: Overall pretty well.  Over last 5 years or so, increased anxiety.  It is intermittently difficult to deal with.  Persistent nagging anxiety for a period of time.  Can interfere with problem solving and seeing the big picture. Got mouthpiece for OSA and sleep quality is much better with better alertness.   Patient reports stable mood and denies depressed or irritable moods.   Patient denies difficulty with sleep initiation or maintenance. Denies appetite disturbance.  Patient reports that energy and motivation have been good.  Patient denies any difficulty with concentration.  Patient denies any suicidal ideation. Cycles of anxiety  a week out of the month. Plan no changes  03/22/22 appt noted: Good and very steady.  No swings. Patient reports stable mood and denies depressed or irritable moods.  Patient denies any recent difficulty with anxiety.  Patient denies difficulty with sleep initiation or maintenance. Denies appetite disturbance.  Patient reports that energy and motivation have been good.  Patient denies any difficulty with concentration.  Patient denies any suicidal ideation. Sleep is pretty good. Not as good in winter  Exercise helps. Works from home.  [name].  Past Psychiatric Medication Trials: Lithium, fluvoxamine, risperidone, lamotrigine 200 mg cognitive side effect, buspirone SE, Ambien, trazodone  2 prior significant manic episodes  .Review of Systems:  Review of Systems  Gastrointestinal:  Negative for  diarrhea and nausea.  Neurological:  Negative for dizziness and tremors.  Psychiatric/Behavioral:  Negative for agitation, behavioral problems, confusion, decreased concentration, dysphoric mood, hallucinations, self-injury, sleep disturbance and suicidal ideas. The patient is not nervous/anxious and is not hyperactive.     Medications: I have reviewed the patient's current medications.  Current Outpatient Medications  Medication Sig Dispense Refill   Acetylcysteine 600 MG CAPS Take 1,200 mg by mouth daily.     b complex vitamins tablet Take 1 tablet by mouth daily.     cholecalciferol (VITAMIN D3) 25 MCG (1000 UT) tablet Take 1,000 Units by mouth daily.     magnesium 30 MG tablet Take 30 mg by mouth daily.     Omega-3 Fatty Acids (FISH OIL) 1000 MG CAPS Take by mouth.     Probiotic Product (PROBIOTIC-10 PO) Take by mouth.     zolpidem (AMBIEN) 10 MG tablet Take 1/2-1 tablet QHS PRN insomnia (Patient taking differently: Rarely used) 90 tablet 0   lamoTRIgine (LAMICTAL) 200 MG tablet Take 1 tablet (200 mg total) by mouth daily. 90 tablet 1   lithium carbonate 300 MG capsule Take 3 capsules (900 mg total) by mouth daily. 270 capsule 1   No current facility-administered medications for this visit.    Medication Side Effects: dry mouth.  No tremor nor diarrhea.  ? Mild dulling or lethargy  Allergies: No Known Allergies  History reviewed. No pertinent past medical history.  History reviewed. No pertinent family history.  Social History   Socioeconomic History   Marital status: Married    Spouse name: Not on file   Number of children: Not on file   Years of education: Not on file   Highest education level: Not on file  Occupational History   Not on file  Tobacco Use   Smoking status: Never   Smokeless tobacco: Never  Substance and Sexual Activity   Alcohol use: Not on file   Drug use: Not on file   Sexual activity: Not on file  Other Topics Concern   Not on file  Social  History Narrative   Not on file   Social Determinants of Health   Financial Resource Strain: Not on file  Food Insecurity: Not on file  Transportation Needs: Not on file  Physical Activity: Not on file  Stress: Not on file  Social Connections: Not on file  Intimate Partner Violence: Not on file    Past Medical History, Surgical history, Social history, and Family history were reviewed and updated as appropriate.   Please see review of systems for further details on the patient's review from today.   Objective:   Physical Exam:  There were no vitals taken for this visit.  Physical Exam Constitutional:  General: He is not in acute distress. Musculoskeletal:        General: No deformity.  Neurological:     Mental Status: He is alert and oriented to person, place, and time.     Coordination: Coordination normal.     Gait: Gait normal.  Psychiatric:        Attention and Perception: He is attentive.        Mood and Affect: Mood is not anxious or depressed. Affect is not labile, blunt, angry, tearful or inappropriate.        Speech: Speech normal. Speech is not rapid and pressured or slurred.        Behavior: Behavior normal. Behavior is not slowed.        Thought Content: Thought content normal. Thought content is not delusional. Thought content does not include homicidal or suicidal ideation. Thought content does not include suicidal plan.        Cognition and Memory: Cognition normal.        Judgment: Judgment normal.     Comments: Insight intact. No auditory or visual hallucinations. No delusions. .      Lab Review:     Component Value Date/Time   NA 138 05/26/2020 0820   K 4.1 05/26/2020 0820   CL 103 05/26/2020 0820   CO2 28 05/26/2020 0820   GLUCOSE 111 05/26/2020 0820   BUN 19 05/26/2020 0820   CREATININE 1.15 05/26/2020 0820   CALCIUM 9.5 05/26/2020 0820    No results found for: "WBC", "RBC", "HGB", "HCT", "PLT", "MCV", "MCH", "MCHC", "RDW",  "LYMPHSABS", "MONOABS", "EOSABS", "BASOSABS"  Lithium Lvl  Date Value Ref Range Status  05/26/2020 0.6 0.6 - 1.2 mmol/L Final     No results found for: "PHENYTOIN", "PHENOBARB", "VALPROATE", "CBMZ"   Dr. Clelia Croft 08/03/2021 lithium level 0.8 creatinine 1.1 calcium 8.4, TSH 3.6  .res Assessment: Plan:    Gale was seen today for follow-up and anxiety.  Diagnoses and all orders for this visit:  Bipolar I disorder (HCC) -     lamoTRIgine (LAMICTAL) 200 MG tablet; Take 1 tablet (200 mg total) by mouth daily. -     lithium carbonate 300 MG capsule; Take 3 capsules (900 mg total) by mouth daily.  Generalized anxiety disorder  Insomnia due to mental condition  Lithium use    Greater than 50% of 30-minute face to face time with patient and his wife was spent on counseling and coordination of care. We discussed the following: He is not having any mood swings. He feels the best in a year.  He feels his mood is better on lamotrigine 200 mg daily and there are no significant cognitive side effects at this dose.  Option retry buspirone vs BZ discussed. For residual anxiety.  He'd like to try buspirone again.  Disc how to manage dosing and prior nausea problems with buspirone.  Disc sleep hygiene and usig light blocking masks.  Sleep masks Answered questions about melatonin.  Option increase lamotrigine vs lithium.  Disc Se concerns.  Disc pros cons.  Continue lithium to 900 mg daily. Continue lamotrigine 200 mg daily. He knows higher doses of lamotrigine can cause mild cognitive side effects   Counseled patient regarding potential benefits, risks, and side effects of lithium to include potential risk of lithium affecting thyroid and renal function.  Discussed need for periodic lab monitoring to determine drug level and to assess for potential adverse effects.  Counseled patient regarding signs and symptoms of lithium toxicity and advised  that they notify office immediately or seek urgent  medical attention if experiencing these signs and symptoms.  Patient advised to contact office with any questions or concerns.   Disc example of lithium toxicity and dehydration and DDI. Labs good. Repeat lithium level  He has questions around the value of the other supplements.   And continue the NAC.  He is cognitively improved with the NAC.  This appt was 30 mins.  FU 6 mos  Meredith Staggers, MD, DFAPA  Please see After Visit Summary for patient specific instructions.  No future appointments.  No orders of the defined types were placed in this encounter.      -------------------------------

## 2022-03-25 LAB — LITHIUM LEVEL: Lithium Lvl: 0.6 mmol/L (ref 0.6–1.2)

## 2022-07-18 ENCOUNTER — Telehealth: Payer: Self-pay | Admitting: Psychiatry

## 2022-07-18 NOTE — Telephone Encounter (Signed)
Updated profile as requested.

## 2022-07-18 NOTE — Telephone Encounter (Signed)
Pt called reporting going forward send all Rx to [facility]. Please update chart

## 2022-08-17 ENCOUNTER — Other Ambulatory Visit: Payer: Self-pay | Admitting: Psychiatry

## 2022-08-17 DIAGNOSIS — F319 Bipolar disorder, unspecified: Secondary | ICD-10-CM

## 2022-08-22 ENCOUNTER — Other Ambulatory Visit: Payer: Self-pay | Admitting: Internal Medicine

## 2022-08-22 DIAGNOSIS — E785 Hyperlipidemia, unspecified: Secondary | ICD-10-CM

## 2022-09-12 ENCOUNTER — Telehealth: Payer: Self-pay | Admitting: Psychiatry

## 2022-09-12 NOTE — Telephone Encounter (Signed)
Pt lvm that he had a physical and bloodwork. He is now hypo thyroid. 4.39 is his level. He would lie to talk to dr. Jennelle Human. Please call him at [phone]

## 2022-09-12 NOTE — Telephone Encounter (Signed)
Appt 12/19 

## 2022-09-20 ENCOUNTER — Encounter: Payer: Self-pay | Admitting: Psychiatry

## 2022-09-20 ENCOUNTER — Ambulatory Visit (INDEPENDENT_AMBULATORY_CARE_PROVIDER_SITE_OTHER): Payer: 59 | Admitting: Psychiatry

## 2022-09-20 DIAGNOSIS — F5105 Insomnia due to other mental disorder: Secondary | ICD-10-CM

## 2022-09-20 DIAGNOSIS — F319 Bipolar disorder, unspecified: Secondary | ICD-10-CM

## 2022-09-20 DIAGNOSIS — Z79899 Other long term (current) drug therapy: Secondary | ICD-10-CM | POA: Diagnosis not present

## 2022-09-20 DIAGNOSIS — F411 Generalized anxiety disorder: Secondary | ICD-10-CM | POA: Diagnosis not present

## 2022-09-20 MED ORDER — LITHIUM CARBONATE 300 MG PO CAPS: 900.0000 mg | ORAL_CAPSULE | Freq: Every day | ORAL | 1 refills | Status: DC

## 2022-09-20 MED ORDER — LAMOTRIGINE 200 MG PO TABS: 200.0000 mg | ORAL_TABLET | Freq: Every day | ORAL | 1 refills | Status: DC

## 2022-09-20 NOTE — Progress Notes (Signed)
Julian [name] [id number] 05-13-1971 51 y.o.  Subjective:   Patient ID:  Julian [name] is a 51 y.o. (DOB 24-Apr-1971) male.  Chief Complaint:  Chief Complaint  Patient presents with   Follow-up    Bipolar I disorder (HCC)   Anxiety    HPI Julian [name] presents to the office today for follow-up of adding lithium and reducing lamotrigine.  At the visit in June we increased lithium to 750 mg daily because of a low lithium level and started tapering lamotrigine in hopes that he could be a lithium mono responder.  He was also scheduled to get a lithium level.  At the visit May 16, 2019 the following change was made: Last lithium level was 0.5 which is still at the low end of the normal range. Due to low lithium level rec increase into the normal range by increasing lithium 900 mg daily . After 2 weeks of 50 mg lamotrigine Then stop lamotrigine.  At visit November started buspirone. Weaning off lamotrigine was hard.  Felt bad, tingling fingers, etc.  Lasted a couple of weeks.  November 24 called and wanted to restart lamotrigine at 75 mg today.  Noticed cognitive SE at higher doses.  [name] noticed the benefit too, more positive and relaxed.   seen December 2020.  He stated the following and no meds were changed.  I feel like this is the right combination.  Even noticed restarting 25 mg of lamotrigine he saw benefit. Better cognition with less also of the lamotrigine.  I like the stability I have right now.   As of [name] 30, 2021:  Seen with [name] 12/03/19 called and wanted to increase lamotrigine to 100 bc mixed anxiety and depression.  He thinks the right amount is 150 mg daily.  Saw benefit substantially with the increase.  Still not quite where he wants to be.  Stressful bc changing jobs to new company.  Anxiety and depression is still abnormal.   W noted SOB, poor sleep and a lot of anxiety.  Still a little depressed and irritable. No sig cognitive problems. Plan:  He is  not having any mood swings.  However he is more anxious and a little depressed with low dose of lamotrigine.  However he is cognitively sharper with low-dosel lamotrigine.  Option increase lamotrigine vs lithium.  Disc Se concerns.  Disc pros cons.  Increase lithium to 1200 mg daily. If this fails to improve his residual symptoms of depression and anxiety then we will reduced lithium back to 900 mg daily and increase lamotrigine to either 125 or 150 mg daily. He knows at higher doses of lamotrigine he does have some mild cognitive side effects of it therefore increasing the lithium may prevent that side effect and resolve his symptoms.  January 29, 2020 appointment the following is noted: Took lithium 1200 mg for 9 days bc it didn't helpt the irritability enough or depression.  So increased lamotrigine to 150 mg daily and dropped lithium to 900 mg for the last few weeks.  Been really good since.   This is the best I've felt in a long time.  Tolerated cognitively well.   Still believes the lithium is a wonder drug.  Very stable.  Patient reports stable mood, and less depressed or irritable moods.  Without lamotrigine he has more anxiety and irritability.  Still is more intense and harder to relax than it used to be but mild.  Positive stress of new job and gets up  early to go work out.  Light sleeper.  Patient denies difficulty with sleep initiation or maintenance, but works hard to get enough sleep.  Usually taking Ambien lately and not trazodone.  Sleep fine with it.  Denies appetite disturbance.  Patient reports that energy and motivation have been good.  Patient denies any difficulty with concentration.  Patient denies any suicidal ideation. Lately working hard and more tired the last few days. Option increase lamotrigine vs lithium.  Disc Se concerns.  Disc pros cons.  Continue lithium to 900 mg daily. Continue lamotrigine 150 mg daily.  04/29/20 appt with the following noted: Taking NAC  longterm. Really pleased where I am cognitively, mood and anxiety. Pleased with doses and SE. Patient reports stable mood and denies depressed or irritable moods.  Patient denies any recent difficulty with anxiety.  Patient denies difficulty with sleep initiation or maintenance but chronic worry over sleep. Ambien rarely and trazodone more rare.  Denies appetite disturbance.  Patient reports that energy and motivation have been good.  Patient denies any difficulty with concentration.  Patient denies any suicidal ideation. Recognizes at 200 lamotrigine had sig cog problems.  Wonders about lithium delaying response time. Plan no changes  10/29/20 appt noted: Still doing well.  Wants to have more intensity and engagement.  But happy and content and productive.  Lost a little edge for pushing but it's not enough to make a change.  No concerns from wife.   Sleep great with melatonin 5 mg.  Ambien 2 monthly.  Not a big fan of trazodone. Patient reports stable mood and denies depressed or irritable moods.  Patient denies any recent difficulty with anxiety.  Patient denies difficulty with sleep initiation or maintenance. Denies appetite disturbance.  Patient reports that energy and motivation have been good.  Patient denies any difficulty with concentration.  Patient denies any suicidal ideation. Plan: No med change  12/10/2020 phone call patient wanted to increase lamotrigine from 150 to 175 mg daily.  Okay. 02/01/2021 phone call wanting to increase lamotrigine from 175 mg to 200 mg daily.  Agreed.  03/30/2021 appointment with the following noted: Great now.  Feels he's better witn the increase lamotrigine to 200 mg daily.  Needs lithium and lamotrigine. No cognitive problems with the lamotrigine now.  Disproved by changing doses.  Satisfied. Patient reports stable mood and denies depressed or irritable moods.  Patient denies any recent difficulty with anxiety.  Patient denies difficulty with sleep  initiation or maintenance. Denies appetite disturbance.  Patient reports that energy and motivation have been good.  Patient denies any difficulty with concentration.  Patient denies any suicidal ideation. Tolerating lithium well. Dentist checked him for OSA-  trying retainer despite scoring in the mild range of OSA. New doc Dr. Eric [facility] Plan continue lithium 900 and lamotrigine 200 mg daily and get lithium labs  09/21/2021 appt noted: Overall pretty well.  Over last 5 years or so, increased anxiety.  It is intermittently difficult to deal with.  Persistent nagging anxiety for a period of time.  Can interfere with problem solving and seeing the big picture. Got mouthpiece for OSA and sleep quality is much better with better alertness.   Patient reports stable mood and denies depressed or irritable moods.   Patient denies difficulty with sleep initiation or maintenance. Denies appetite disturbance.  Patient reports that energy and motivation have been good.  Patient denies any difficulty with concentration.  Patient denies any suicidal ideation. Cycles of anxiety  a week out of the month. Plan no changes  03/22/22 appt noted: Good and very steady.  No swings. Patient reports stable mood and denies depressed or irritable moods.  Patient denies any recent difficulty with anxiety.  Patient denies difficulty with sleep initiation or maintenance. Denies appetite disturbance.  Patient reports that energy and motivation have been good.  Patient denies any difficulty with concentration.  Patient denies any suicidal ideation. Sleep is pretty good. Not as good in winter  09/20/22 appt noted: TSH 4.39.  [facility]  Dr. Clelia Croft.  Annual check up.   Notices cold intolerance.  Gained a little weight.   Wonders if lithium is the cause.  Mood is really good.  Years past sometimes holidays will cause mood instability and now just as steady as he could be.  Maybe a little unmotivated  and lethargic at times.  Pretty good at work.  Good annual review.     Exercise helps. Works from home.  [name].  Past Psychiatric Medication Trials: Lithium, fluvoxamine, risperidone, lamotrigine 200 mg cognitive side effect, buspirone SE, Ambien, trazodone  2 prior significant manic episodes  M his hypothyroidism   .Review of Systems:  Review of Systems  Gastrointestinal:  Negative for diarrhea and nausea.  Neurological:  Negative for dizziness and tremors.  Psychiatric/Behavioral:  Negative for agitation, behavioral problems, confusion, decreased concentration, dysphoric mood, hallucinations, self-injury, sleep disturbance and suicidal ideas. The patient is not nervous/anxious and is not hyperactive.     Medications: I have reviewed the patient's current medications.  Current Outpatient Medications  Medication Sig Dispense Refill   Acetylcysteine 600 MG CAPS Take 1,200 mg by mouth daily.     b complex vitamins tablet Take 1 tablet by mouth daily.     cholecalciferol (VITAMIN D3) 25 MCG (1000 UT) tablet Take 1,000 Units by mouth daily.     magnesium 30 MG tablet Take 30 mg by mouth daily.     Omega-3 Fatty Acids (FISH OIL) 1000 MG CAPS Take by mouth.     Probiotic Product (PROBIOTIC-10 PO) Take by mouth.     zolpidem (AMBIEN) 10 MG tablet Take 1/2-1 tablet QHS PRN insomnia (Patient taking differently: Rarely used) 90 tablet 0   lamoTRIgine (LAMICTAL) 200 MG tablet Take 1 tablet (200 mg total) by mouth daily. 90 tablet 1   lithium carbonate 300 MG capsule Take 3 capsules (900 mg total) by mouth daily. 270 capsule 1   No current facility-administered medications for this visit.    Medication Side Effects: dry mouth.  No tremor nor diarrhea.  ? Mild dulling or lethargy  Allergies: No Known Allergies  History reviewed. No pertinent past medical history.  History reviewed. No pertinent family history.  Social History   Socioeconomic History   Marital status: Married     Spouse name: Not on file   Number of children: Not on file   Years of education: Not on file   Highest education level: Not on file  Occupational History   Not on file  Tobacco Use   Smoking status: Never   Smokeless tobacco: Never  Substance and Sexual Activity   Alcohol use: Not on file   Drug use: Not on file   Sexual activity: Not on file  Other Topics Concern   Not on file  Social History Narrative   Not on file   Social Determinants of Health   Financial Resource Strain: Not on file  Food Insecurity: Not on file  Transportation Needs:  Not on file  Physical Activity: Not on file  Stress: Not on file  Social Connections: Not on file  Intimate Partner Violence: Not on file    Past Medical History, Surgical history, Social history, and Family history were reviewed and updated as appropriate.   Please see review of systems for further details on the patient's review from today.   Objective:   Physical Exam:  There were no vitals taken for this visit.  Physical Exam Constitutional:      General: He is not in acute distress. Musculoskeletal:        General: No deformity.  Neurological:     Mental Status: He is alert and oriented to person, place, and time.     Coordination: Coordination normal.     Gait: Gait normal.  Psychiatric:        Attention and Perception: He is attentive.        Mood and Affect: Mood is not anxious or depressed. Affect is not labile, blunt, angry, tearful or inappropriate.        Speech: Speech normal. Speech is not rapid and pressured or slurred.        Behavior: Behavior normal. Behavior is not slowed.        Thought Content: Thought content normal. Thought content is not delusional. Thought content does not include homicidal or suicidal ideation. Thought content does not include suicidal plan.        Cognition and Memory: Cognition normal.        Judgment: Judgment normal.     Comments: Insight intact. No auditory or visual  hallucinations. No delusions. .      Lab Review:     Component Value Date/Time   NA 138 05/26/2020 0820   K 4.1 05/26/2020 0820   CL 103 05/26/2020 0820   CO2 28 05/26/2020 0820   GLUCOSE 111 05/26/2020 0820   BUN 19 05/26/2020 0820   CREATININE 1.15 05/26/2020 0820   CALCIUM 9.5 05/26/2020 0820    No results found for: "WBC", "RBC", "HGB", "HCT", "PLT", "MCV", "MCH", "MCHC", "RDW", "LYMPHSABS", "MONOABS", "EOSABS", "BASOSABS"  Lithium Lvl  Date Value Ref Range Status  03/24/2022 0.6 0.6 - 1.2 mmol/L Final     No results found for: "PHENYTOIN", "PHENOBARB", "VALPROATE", "CBMZ"   Dr. Clelia CroftShaw 08/03/2021 lithium level 0.8 creatinine 1.1 calcium 8.4, TSH 3.6 09/2022 Lithium 0.6.   [name].res Assessment: Plan:    Merritt was seen today for follow-up and anxiety.  Diagnoses and all orders for this visit:  Bipolar I disorder (HCC) -     lamoTRIgine (LAMICTAL) 200 MG tablet; Take 1 tablet (200 mg total) by mouth daily. -     lithium carbonate 300 MG capsule; Take 3 capsules (900 mg total) by mouth daily.  Generalized anxiety disorder  Insomnia due to mental condition  Lithium use     Greater than 50% of 30-minute face to face time with patient and his wife was spent on counseling and coordination of care. We discussed the following: He is not having any mood swings. He feels the best in a year.  He feels his mood is better on lamotrigine 200 mg daily and there are no significant cognitive side effects at this dose.  Option retry buspirone vs BZ discussed. For residual anxiety.  He'd like to try buspirone again.  Disc how to manage dosing and prior nausea problems with buspirone.  Disc sleep hygiene and usig light blocking masks.  Sleep masks Answered questions about melatonin.  Option increase lamotrigine vs lithium.  Disc Se concerns.  Disc pros cons.  Continue lithium to 900 mg daily. Continue lamotrigine 200 mg daily. He knows higher doses of lamotrigine can cause mild  cognitive side effects   Counseled patient regarding potential benefits, risks, and side effects of lithium to include potential risk of lithium affecting thyroid and renal function.  Discussed need for periodic lab monitoring to determine drug level and to assess for potential adverse effects.  Counseled patient regarding signs and symptoms of lithium toxicity and advised that they notify office immediately or seek urgent medical attention if experiencing these signs and symptoms.  Patient advised to contact office with any questions or concerns.   Disc example of lithium toxicity and dehydration and DDI. Disc borderline TSH from PCP.   Extensive discussion.  He'll repeat in 3 mos.  Disc difference between T3 and T4.  Repeat in 3 mos Repeat lithium level  He has questions around the value of the other supplements.   And continue the NAC.  He is cognitively improved with the NAC.  This appt was 30 mins.  FU 6 mos  Julian Staggers, MD, DFAPA  Please see After Visit Summary for patient specific instructions.  Future Appointments  Date Time Provider Department Center  10/11/2022  9:10 AM [facility] 2 [facility] [address] [facility]    No orders of the defined types were placed in this encounter.      -------------------------------

## 2022-10-11 ENCOUNTER — Ambulatory Visit
Admission: RE | Admit: 2022-10-11 | Discharge: 2022-10-11 | Disposition: A | Discharge status: Home / Self Care | Payer: No Typology Code available for payment source | Source: Ambulatory Visit | Attending: Internal Medicine | Admitting: Internal Medicine

## 2022-10-11 DIAGNOSIS — E785 Hyperlipidemia, unspecified: Secondary | ICD-10-CM

## 2022-12-07 ENCOUNTER — Other Ambulatory Visit: Payer: Self-pay | Admitting: Psychiatry

## 2022-12-07 DIAGNOSIS — F319 Bipolar disorder, unspecified: Secondary | ICD-10-CM

## 2022-12-07 IMAGING — MR MR KNEE*R* W/O CM
4 of 7 series · 22 of 40 positions shown · non-contrast
Comparison: None.

CLINICAL DATA: Generalized knee pain for 2 weeks

EXAM:
MRI OF THE RIGHT KNEE WITHOUT CONTRAST
TECHNIQUE: Multiplanar, multisequence MR imaging of the knee was performed. No
intravenous contrast was administered.

[Series 3: T2 fat-sat · axial · 4.0mm · 0.50mm/px · z∈[-65,+80]mm · 7 of 30 slices shown]
[im 1/30]
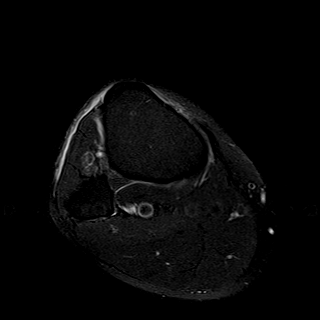
[im 5/30]
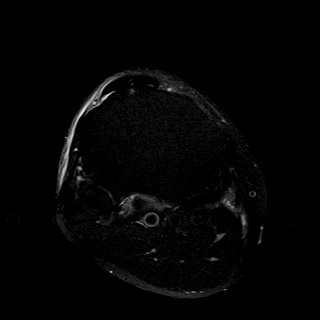
[im 10/30]
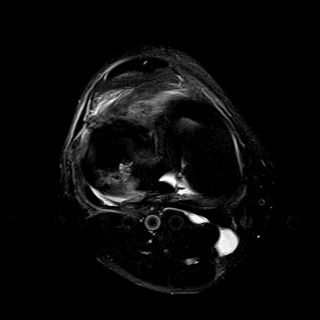
[im 15/30]
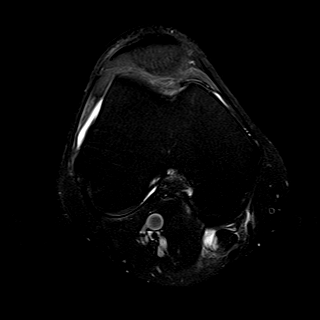
[im 20/30]
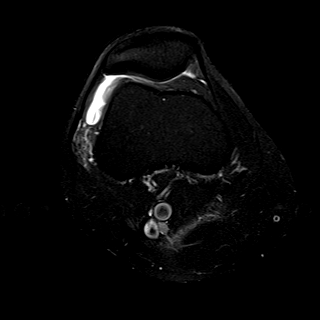
[im 25/30]
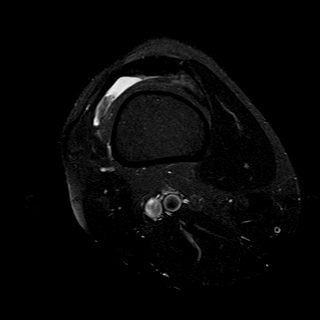
[im 30/30]
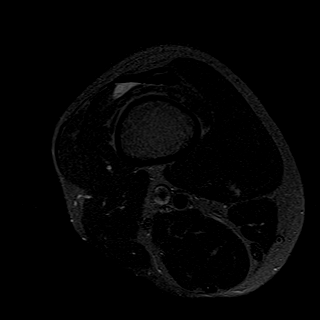

[Series 6: PD fat-sat · coronal · 3.0mm · 0.29mm/px · 6 of 28 slices shown (1 of 3)]
[im 1/28]
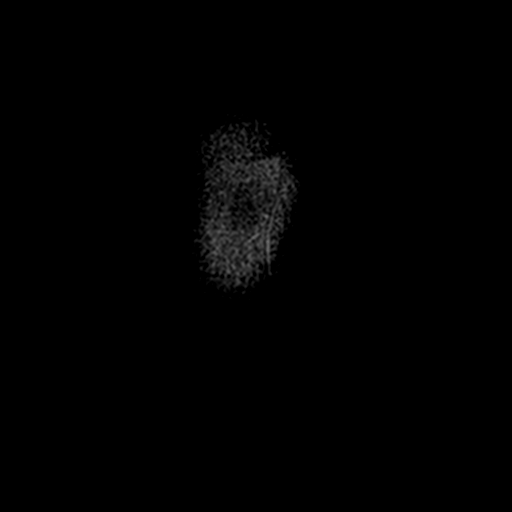
[im 6/28]
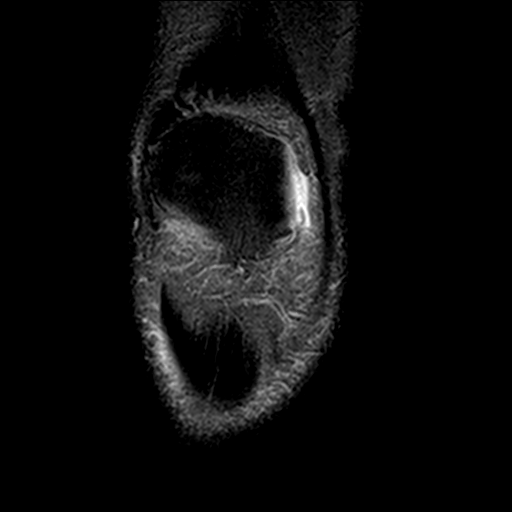
[im 11/28]
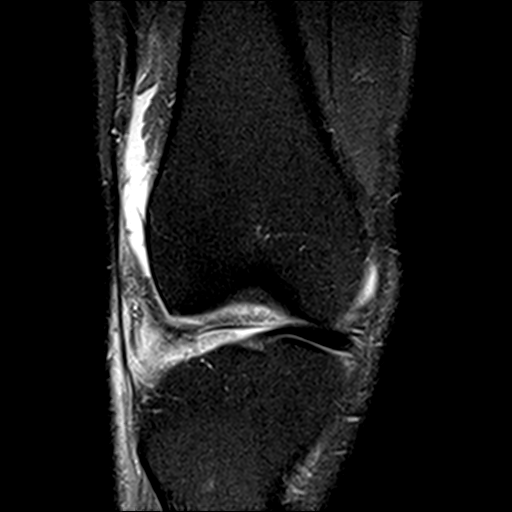
[im 17/28]
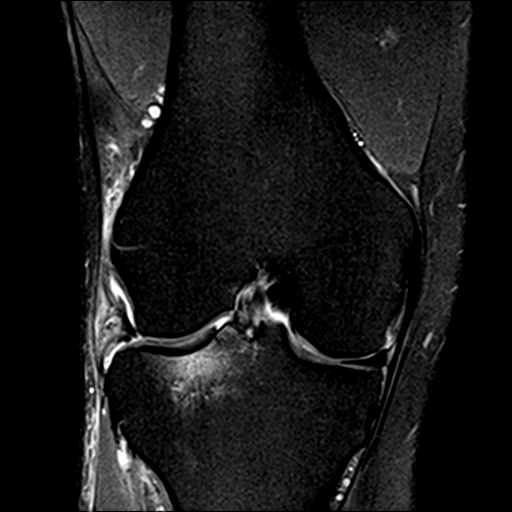
[im 22/28]
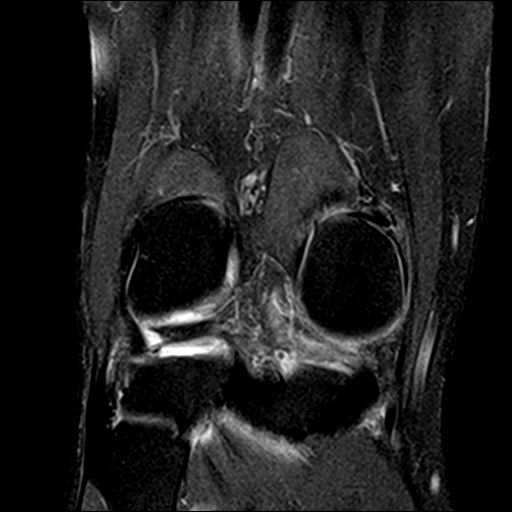
[im 28/28]
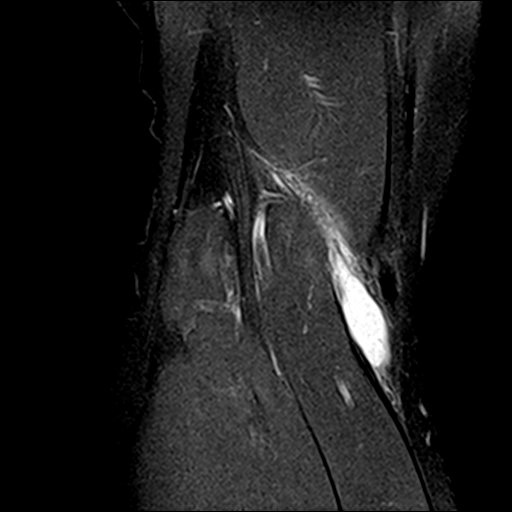

[Series 7: PD fat-sat · sagittal · 3.0mm · 0.29mm/px · 7 of 30 slices shown (2 of 3)]
[im 1/30]
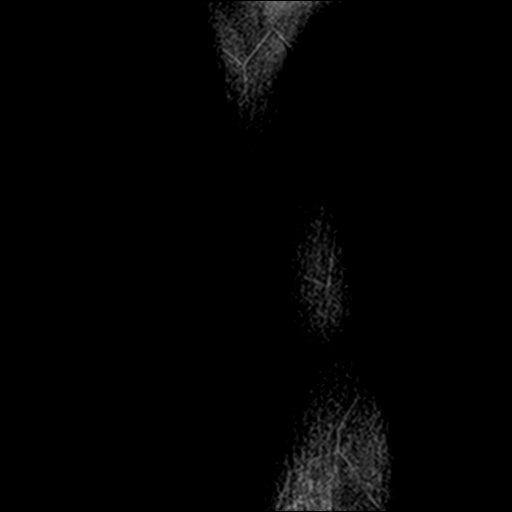
[im 5/30]
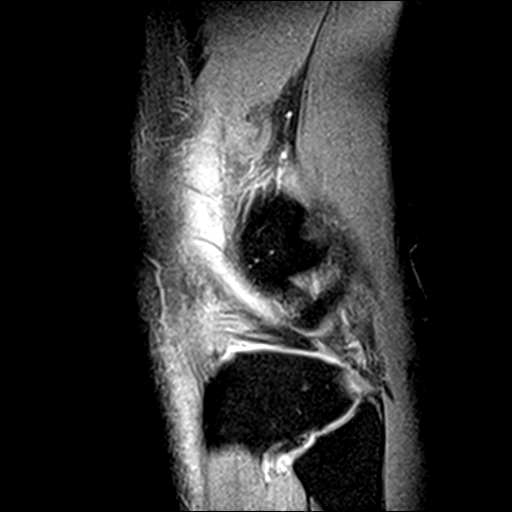
[im 10/30]
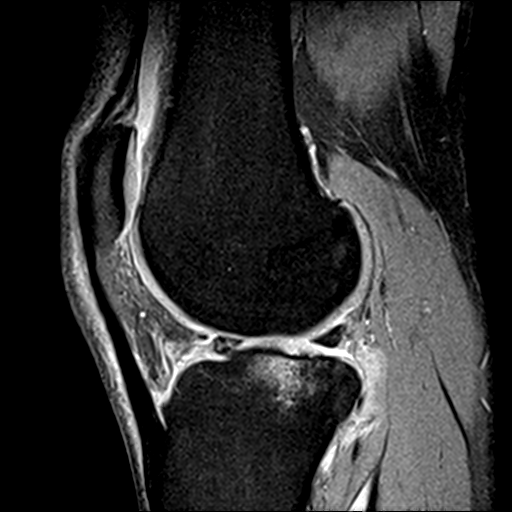
[im 15/30]
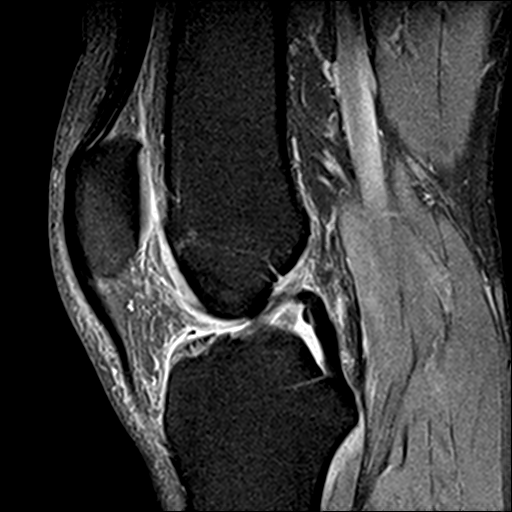
[im 20/30]
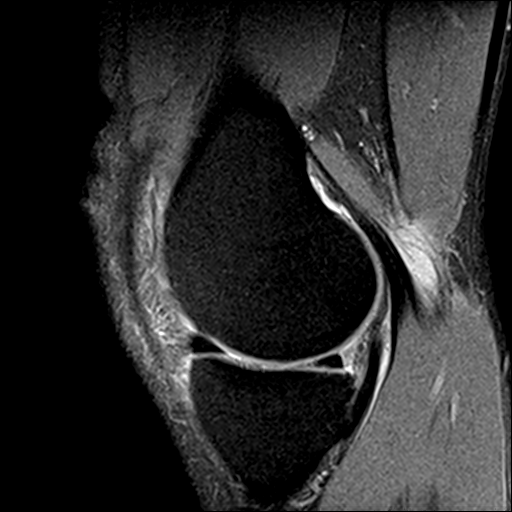
[im 25/30]
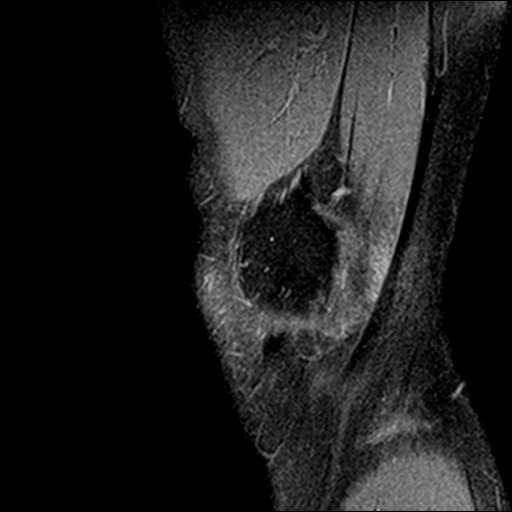
[im 30/30]
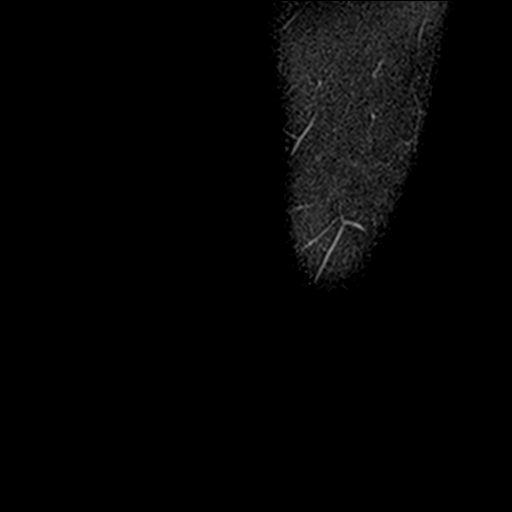

[Series 9: PD fat-sat · oblique · 2.0mm · 0.29mm/px · 2 of 11 slices shown (3 of 3)]
[im 1/11]
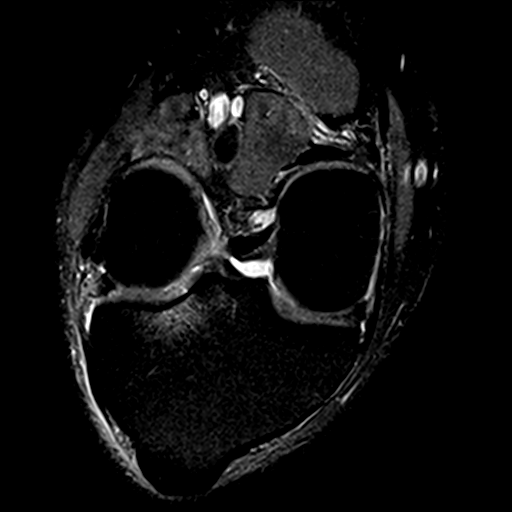
[im 11/11]
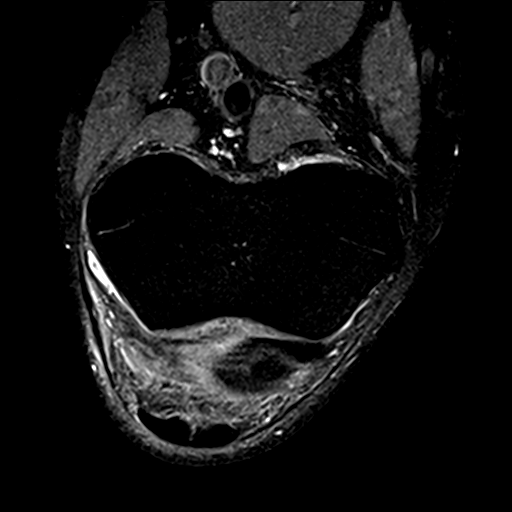

[22 of 40 positions shown; findings below may reference images not displayed]

FINDINGS: MENISCI

Medial: Focal free edge fraying of the posterior horn medial
meniscus. No displaced tear.

Lateral: There is tearing of the anterior horn and body of the
lateral meniscus, with substance loss in the meniscal body and mild
extrusion.

LIGAMENTS

Cruciates: ACL and PCL are intact.

Collaterals: Medial collateral ligament is intact. Lateral
collateral ligament complex is intact.

CARTILAGE

Patellofemoral: Normal patellar cartilage. There is mild trochlear
chondrosis, with likely central fissuring with mild adjacent
subchondral edema.

Medial: Mild partial-thickness cartilage loss of the medial
femorotibial compartment.

Lateral: There is a 1.8 x 0.9 cm near full-thickness chondral defect
along the posterior weight-bearing lateral femoral condyle (sagittal
T2 image 7, coronal T2 image 9). Additionally, there is deep
fissuring/full-thickness chondral defect measuring 0.6 x 0.8 cm
(sagittal T2 image 9, coronal PD image 11). There is adjacent
intense subchondral bony edema in the lateral tibial plateau.
Possible osteochondral fragment near the popliteus hiatus measuring
9 mm (sagittal T2 image 10).

JOINT: There is a very small joint effusion. Normal Vance Dor.
No plical thickening.

POPLITEAL FOSSA: Popliteus tendon is intact. There is a small Baker
cyst.

EXTENSOR MECHANISM: Intact quadriceps tendon. Intact patellar
tendon. Intact lateral patellar retinaculum. Intact medial patellar
retinaculum. Intact MPFL.

BONES: Bony edema adjacent to cartilaginous abnormalities on lateral
compartment. No definite subchondral fracture at this time.

Other: No fluid collection or hematoma. Muscles are normal.
IMPRESSION: Degenerative tearing of the anterior horn and body of the lateral
meniscus.

Essentially full-thickness chondral defects along the posterior
weight-bearing lateral femoral condyle measuring 1.8 x 0.9 cm and
along the lateral tibial plateau measuring 0.6 x 0.8 cm. Possible
osteochondral fragment near the popliteus hiatus measuring 9 mm.

Intense subchondral bony edema adjacent to the chondral defect in
the lateral tibial plateau, without definite subchondral fracture at
this time.

Focal free edge fraying of the posterior horn medial meniscus. No
displaced medial meniscal tear.

Mild trochlear chondrosis with likely central fissuring.

## 2023-02-28 ENCOUNTER — Other Ambulatory Visit: Payer: Self-pay | Admitting: Psychiatry

## 2023-02-28 ENCOUNTER — Telehealth: Payer: Self-pay | Admitting: Psychiatry

## 2023-02-28 MED ORDER — ZOLPIDEM TARTRATE 10 MG PO TABS
ORAL_TABLET | ORAL | 0 refills | Status: DC
Start: 2023-02-28 — End: 2023-02-28

## 2023-02-28 MED ORDER — ZOLPIDEM TARTRATE 10 MG PO TABS
ORAL_TABLET | ORAL | 0 refills | Status: DC
Start: 2023-02-28 — End: 2024-01-30

## 2023-02-28 NOTE — Telephone Encounter (Signed)
Pt requesting RF Ambien [facility]. Apt  6/19

## 2023-03-20 ENCOUNTER — Telehealth: Payer: Self-pay | Admitting: Psychiatry

## 2023-03-20 NOTE — Telephone Encounter (Signed)
Pt Lvm @ 8:12a.  He is out of lithium.  His mail order has not arrived yet.  He is asking if Dr Jennelle Human will send in a script to [facility].  Next appt 6/19

## 2023-03-20 NOTE — Telephone Encounter (Signed)
Last RF was not sent to mail order. Called [facility] and asked them to fill. Patient notified.

## 2023-03-22 ENCOUNTER — Ambulatory Visit (INDEPENDENT_AMBULATORY_CARE_PROVIDER_SITE_OTHER): Payer: 59 | Admitting: Psychiatry

## 2023-03-22 ENCOUNTER — Encounter: Payer: Self-pay | Admitting: Psychiatry

## 2023-03-22 DIAGNOSIS — F319 Bipolar disorder, unspecified: Secondary | ICD-10-CM | POA: Diagnosis not present

## 2023-03-22 MED ORDER — BUPROPION HCL ER (XL) 150 MG PO TB24
ORAL_TABLET | ORAL | 0 refills | Status: DC
Start: 2023-03-22 — End: 2023-04-02

## 2023-03-22 NOTE — Progress Notes (Signed)
Julian [name] [id number] 03/05/1971 52 y.o.  Subjective:   Patient ID:  Julian [name] is a 52 y.o. (DOB Feb 18, 1971) male.  Chief Complaint:  Chief Complaint  Patient presents with   Follow-up    Mood and med concerns    HPI Julian [name] presents to the office today for follow-up of adding lithium and reducing lamotrigine.  At the visit in June we increased lithium to 750 mg daily because of a low lithium level and started tapering lamotrigine in hopes that he could be a lithium mono responder.  He was also scheduled to get a lithium level.  At the visit May 16, 2019 the following change was made: Last lithium level was 0.5 which is still at the low end of the normal range. Due to low lithium level rec increase into the normal range by increasing lithium 900 mg daily . After 2 weeks of 50 mg lamotrigine Then stop lamotrigine.  At visit November started buspirone. Weaning off lamotrigine was hard.  Felt bad, tingling fingers, etc.  Lasted a couple of weeks.  November 24 called and wanted to restart lamotrigine at 75 mg today.  Noticed cognitive SE at higher doses.  [name] noticed the benefit too, more positive and relaxed.   seen December 2020.  He stated the following and no meds were changed.  I feel like this is the right combination.  Even noticed restarting 25 mg of lamotrigine he saw benefit. Better cognition with less also of the lamotrigine.  I like the stability I have right now.   As of [name] 30, 2021:  Seen with [name] 12/03/19 called and wanted to increase lamotrigine to 100 bc mixed anxiety and depression.  He thinks the right amount is 150 mg daily.  Saw benefit substantially with the increase.  Still not quite where he wants to be.  Stressful bc changing jobs to new company.  Anxiety and depression is still abnormal.   W noted SOB, poor sleep and a lot of anxiety.  Still a little depressed and irritable. No sig cognitive problems. Plan:  He is not having any  mood swings.  However he is more anxious and a little depressed with low dose of lamotrigine.  However he is cognitively sharper with low-dosel lamotrigine.  Option increase lamotrigine vs lithium.  Disc Se concerns.  Disc pros cons.  Increase lithium to 1200 mg daily. If this fails to improve his residual symptoms of depression and anxiety then we will reduced lithium back to 900 mg daily and increase lamotrigine to either 125 or 150 mg daily. He knows at higher doses of lamotrigine he does have some mild cognitive side effects of it therefore increasing the lithium may prevent that side effect and resolve his symptoms.  January 29, 2020 appointment the following is noted: Took lithium 1200 mg for 9 days bc it didn't helpt the irritability enough or depression.  So increased lamotrigine to 150 mg daily and dropped lithium to 900 mg for the last few weeks.  Been really good since.   This is the best I've felt in a long time.  Tolerated cognitively well.   Still believes the lithium is a wonder drug.  Very stable.  Patient reports stable mood, and less depressed or irritable moods.  Without lamotrigine he has more anxiety and irritability.  Still is more intense and harder to relax than it used to be but mild.  Positive stress of new job and gets up early to go  work out.  Light sleeper.  Patient denies difficulty with sleep initiation or maintenance, but works hard to get enough sleep.  Usually taking Ambien lately and not trazodone.  Sleep fine with it.  Denies appetite disturbance.  Patient reports that energy and motivation have been good.  Patient denies any difficulty with concentration.  Patient denies any suicidal ideation. Lately working hard and more tired the last few days. Option increase lamotrigine vs lithium.  Disc Se concerns.  Disc pros cons.  Continue lithium to 900 mg daily. Continue lamotrigine 150 mg daily.  04/29/20 appt with the following noted: Taking NAC longterm. Really pleased  where I am cognitively, mood and anxiety. Pleased with doses and SE. Patient reports stable mood and denies depressed or irritable moods.  Patient denies any recent difficulty with anxiety.  Patient denies difficulty with sleep initiation or maintenance but chronic worry over sleep. Ambien rarely and trazodone more rare.  Denies appetite disturbance.  Patient reports that energy and motivation have been good.  Patient denies any difficulty with concentration.  Patient denies any suicidal ideation. Recognizes at 200 lamotrigine had sig cog problems.  Wonders about lithium delaying response time. Plan no changes  10/29/20 appt noted: Still doing well.  Wants to have more intensity and engagement.  But happy and content and productive.  Lost a little edge for pushing but it's not enough to make a change.  No concerns from wife.   Sleep great with melatonin 5 mg.  Ambien 2 monthly.  Not a big fan of trazodone. Patient reports stable mood and denies depressed or irritable moods.  Patient denies any recent difficulty with anxiety.  Patient denies difficulty with sleep initiation or maintenance. Denies appetite disturbance.  Patient reports that energy and motivation have been good.  Patient denies any difficulty with concentration.  Patient denies any suicidal ideation. Plan: No med change  12/10/2020 phone call patient wanted to increase lamotrigine from 150 to 175 mg daily.  Okay. 02/01/2021 phone call wanting to increase lamotrigine from 175 mg to 200 mg daily.  Agreed.  03/30/2021 appointment with the following noted: Great now.  Feels he's better witn the increase lamotrigine to 200 mg daily.  Needs lithium and lamotrigine. No cognitive problems with the lamotrigine now.  Disproved by changing doses.  Satisfied. Patient reports stable mood and denies depressed or irritable moods.  Patient denies any recent difficulty with anxiety.  Patient denies difficulty with sleep initiation or maintenance. Denies  appetite disturbance.  Patient reports that energy and motivation have been good.  Patient denies any difficulty with concentration.  Patient denies any suicidal ideation. Tolerating lithium well. Dentist checked him for OSA-  trying retainer despite scoring in the mild range of OSA. New doc Dr. Eric Form [facility] Plan continue lithium 900 and lamotrigine 200 mg daily and get lithium labs  09/21/2021 appt noted: Overall pretty well.  Over last 5 years or so, increased anxiety.  It is intermittently difficult to deal with.  Persistent nagging anxiety for a period of time.  Can interfere with problem solving and seeing the big picture. Got mouthpiece for OSA and sleep quality is much better with better alertness.   Patient reports stable mood and denies depressed or irritable moods.   Patient denies difficulty with sleep initiation or maintenance. Denies appetite disturbance.  Patient reports that energy and motivation have been good.  Patient denies any difficulty with concentration.  Patient denies any suicidal ideation. Cycles of anxiety a week out  of the month. Plan no changes  03/22/22 appt noted: Good and very steady.  No swings. Patient reports stable mood and denies depressed or irritable moods.  Patient denies any recent difficulty with anxiety.  Patient denies difficulty with sleep initiation or maintenance. Denies appetite disturbance.  Patient reports that energy and motivation have been good.  Patient denies any difficulty with concentration.  Patient denies any suicidal ideation. Sleep is pretty good. Not as good in winter  09/20/22 appt noted: TSH 4.39.  [facility]  Dr. Clelia Croft.  Annual check up.   Notices cold intolerance.  Gained a little weight.   Wonders if lithium is the cause.  Mood is really good.  Years past sometimes holidays will cause mood instability and now just as steady as he could be.  Maybe a little unmotivated and lethargic at times.  Pretty good  at work.  Good annual review.   Plan: Continue lithium to 900 mg daily. Continue lamotrigine 200 mg daily.  03/22/23 appt noted: Late winter to May wwas a bit dep and lacking motivation to do things other than exercises/sports he likes.  Wife notices.  Ex Spring Break to [facility] and didn't enjoy it. No strong seasonal pattern noted except more active and outside in Spring and summer.   Shorter with  kids than 2 yr ago, irritable.   Exercise helps. Works from home.  Wife [name].  Past Psychiatric Medication Trials: Lithium,  lamotrigine 200 mg cognitive side effect, risperidone, fluvoxamine,   buspirone SE, Ambien, trazodone  2 prior significant manic episodes  M his hypothyroidism   .Review of Systems:  Review of Systems  Gastrointestinal:  Negative for diarrhea and nausea.  Neurological:  Negative for dizziness, tremors and weakness.  Psychiatric/Behavioral:  Negative for agitation, behavioral problems, confusion, decreased concentration, dysphoric mood, hallucinations, self-injury, sleep disturbance and suicidal ideas. The patient is not nervous/anxious and is not hyperactive.     Medications: I have reviewed the patient's current medications.  Current Outpatient Medications  Medication Sig Dispense Refill   Acetylcysteine 600 MG CAPS Take 1,200 mg by mouth daily.     b complex vitamins tablet Take 1 tablet by mouth daily.     buPROPion (WELLBUTRIN XL) 150 MG 24 hr tablet 1 in the AM for 1 week then 2 each AM 30 tablet 0   cholecalciferol (VITAMIN D3) 25 MCG (1000 UT) tablet Take 1,000 Units by mouth daily.     lamoTRIgine (LAMICTAL) 200 MG tablet Take 1 tablet (200 mg total) by mouth daily. 90 tablet 1   lithium carbonate 300 MG capsule TAKE 3 CAPSULES (900 MG TOTAL) BY MOUTH DAILY. 270 capsule 1   magnesium 30 MG tablet Take 30 mg by mouth daily.     Omega-3 Fatty Acids (FISH OIL) 1000 MG CAPS Take by mouth.     Probiotic Product (PROBIOTIC-10 PO) Take by mouth.      zolpidem (AMBIEN) 10 MG tablet Take 1/2-1 tablet QHS PRN insomnia 30 tablet 0   No current facility-administered medications for this visit.    Medication Side Effects: dry mouth.  No tremor nor diarrhea.  ? Mild dulling or lethargy  Allergies: No Known Allergies  History reviewed. No pertinent past medical history.  History reviewed. No pertinent family history.  Social History   Socioeconomic History   Marital status: Married    Spouse name: Not on file   Number of children: Not on file   Years of education: Not on file  Highest education level: Not on file  Occupational History   Not on file  Tobacco Use   Smoking status: Never   Smokeless tobacco: Never  Substance and Sexual Activity   Alcohol use: Not on file   Drug use: Not on file   Sexual activity: Not on file  Other Topics Concern   Not on file  Social History Narrative   Not on file   Social Determinants of Health   Financial Resource Strain: Not on file  Food Insecurity: Not on file  Transportation Needs: Not on file  Physical Activity: Not on file  Stress: Not on file  Social Connections: Not on file  Intimate Partner Violence: Not on file    Past Medical History, Surgical history, Social history, and Family history were reviewed and updated as appropriate.   Please see review of systems for further details on the patient's review from today.   Objective:   Physical Exam:  There were no vitals taken for this visit.  Physical Exam Constitutional:      General: He is not in acute distress. Musculoskeletal:        General: No deformity.  Neurological:     Mental Status: He is alert and oriented to person, place, and time.     Coordination: Coordination normal.     Gait: Gait normal.  Psychiatric:        Attention and Perception: He is attentive.        Mood and Affect: Mood is not anxious or depressed. Affect is not labile, blunt, tearful or inappropriate.        Speech: Speech normal.  Speech is not rapid and pressured or slurred.        Behavior: Behavior normal. Behavior is not slowed.        Thought Content: Thought content normal. Thought content is not delusional. Thought content does not include homicidal or suicidal ideation. Thought content does not include suicidal plan.        Cognition and Memory: Cognition normal.        Judgment: Judgment normal.     Comments: Insight intact. No auditory or visual hallucinations. No delusions. .      Lab Review:     Component Value Date/Time   NA 138 05/26/2020 0820   K 4.1 05/26/2020 0820   CL 103 05/26/2020 0820   CO2 28 05/26/2020 0820   GLUCOSE 111 05/26/2020 0820   BUN 19 05/26/2020 0820   CREATININE 1.15 05/26/2020 0820   CALCIUM 9.5 05/26/2020 0820    No results found for: "WBC", "RBC", "HGB", "HCT", "PLT", "MCV", "MCH", "MCHC", "RDW", "LYMPHSABS", "MONOABS", "EOSABS", "BASOSABS"  Lithium Lvl  Date Value Ref Range Status  03/24/2022 0.6 0.6 - 1.2 mmol/L Final     No results found for: "PHENYTOIN", "PHENOBARB", "VALPROATE", "CBMZ"   Dr. Clelia Croft 08/03/2021 lithium level 0.8 creatinine 1.1 calcium 8.4, TSH 3.6 09/2022 Lithium 0.6.   Marland Kitchenres Assessment: Plan:    Aadon was seen today for follow-up.  Diagnoses and all orders for this visit:  Bipolar I disorder (HCC) -     buPROPion (WELLBUTRIN XL) 150 MG 24 hr tablet; 1 in the AM for 1 week then 2 each AM     30-minute face to face time with patient was spent on counseling and coordination of care. We discussed the following: He is not having any mood swings. He feels the best in a year.  He feels his mood is better on lamotrigine 200 mg  daily and there are no significant cognitive side effects at this dose.  Option retry buspirone vs BZ discussed. For residual anxiety.  He'd like to try buspirone again.  Disc how to manage dosing and prior nausea problems with buspirone.  Disc sleep hygiene and usig light blocking masks.  Sleep masks Answered  questions about melatonin.  Option increase lamotrigine vs lithium.  Disc Se concerns.  Disc pros cons.  Continue lithium to 900 mg daily. Continue lamotrigine 200 mg daily. He knows higher doses of lamotrigine can cause mild cognitive side effects   Counseled patient regarding potential benefits, risks, and side effects of lithium to include potential risk of lithium affecting thyroid and renal function.  Discussed need for periodic lab monitoring to determine drug level and to assess for potential adverse effects.  Counseled patient regarding signs and symptoms of lithium toxicity and advised that they notify office immediately or seek urgent medical attention if experiencing these signs and symptoms.  Patient advised to contact office with any questions or concerns.   Disc example of lithium toxicity and dehydration and DDI. Disc borderline TSH from PCP.   Extensive discussion.  He'll repeat in 3 mos.  Disc difference between T3 and T4.  Repeat in 3 mos Repeat lithium level  continue the NAC.  He is cognitively improved with the NAC.  Starr Wellbutrin incr to 300 AM Other pramipexole or Vraylar, etc  This appt was 30 mins.  FU 2 mos  Meredith Staggers, MD, DFAPA  Please see After Visit Summary for patient specific instructions.  No future appointments.   No orders of the defined types were placed in this encounter.      -------------------------------

## 2023-03-24 ENCOUNTER — Other Ambulatory Visit: Payer: Self-pay | Admitting: Psychiatry

## 2023-03-24 DIAGNOSIS — F319 Bipolar disorder, unspecified: Secondary | ICD-10-CM

## 2023-03-30 ENCOUNTER — Telehealth: Payer: Self-pay | Admitting: Psychiatry

## 2023-03-30 NOTE — Telephone Encounter (Signed)
Pt called and said that he is going on vacation on 7/3. He will run out of his wellbutrin xl 150 mg refilled early before he leaves. He is taking 2 in the am. He will need  a quantity of 60.  [facility] on [address]. So he is asking for the refill to be sent on monday

## 2023-04-02 ENCOUNTER — Other Ambulatory Visit: Payer: Self-pay | Admitting: Psychiatry

## 2023-04-02 DIAGNOSIS — F319 Bipolar disorder, unspecified: Secondary | ICD-10-CM

## 2023-04-03 NOTE — Telephone Encounter (Signed)
Sent 6/30.

## 2023-04-11 ENCOUNTER — Other Ambulatory Visit: Payer: Self-pay | Admitting: Psychiatry

## 2023-04-11 DIAGNOSIS — F319 Bipolar disorder, unspecified: Secondary | ICD-10-CM

## 2023-04-25 ENCOUNTER — Other Ambulatory Visit: Payer: Self-pay | Admitting: Psychiatry

## 2023-04-25 DIAGNOSIS — F319 Bipolar disorder, unspecified: Secondary | ICD-10-CM

## 2023-05-12 ENCOUNTER — Telehealth: Payer: Self-pay | Admitting: Psychiatry

## 2023-05-12 NOTE — Telephone Encounter (Signed)
Patient called in at 2:43pm. He said the "new " medication was great for first couple of weeks and then he leveled out and it's not as effective. He would like to look at a dosage increase.  ( I called pt back to get the name of the med but had to LM).

## 2023-05-12 NOTE — Telephone Encounter (Signed)
Called patient and it went to VM. Did not leave a message since [place] had already done so.

## 2023-05-15 ENCOUNTER — Other Ambulatory Visit: Payer: Self-pay | Admitting: Psychiatry

## 2023-05-15 MED ORDER — BUPROPION HCL ER (XL) 150 MG PO TB24: 450.0000 mg | ORAL_TABLET | Freq: Every day | ORAL | 0 refills | Status: DC

## 2023-05-15 NOTE — Telephone Encounter (Signed)
Rx sent and patient notified.

## 2023-05-15 NOTE — Telephone Encounter (Signed)
LVM to RC 

## 2023-05-15 NOTE — Telephone Encounter (Signed)
Ok to increase bupropion XL to 150 mg tablets, 3 tablets q AM, #90.  Or he can use up the 300 mg tablets by adding 150 mg to it in the AM.  Total dose 450 mg AM.

## 2023-05-15 NOTE — Telephone Encounter (Signed)
Patient reporting that bupropion was fantastic for about 3 weeks but there is no longer any oomph to it. He is taking 300 mg, said you told him an increase was possible. He has FU 8/20, but would like to increase dose.

## 2023-05-23 ENCOUNTER — Ambulatory Visit (INDEPENDENT_AMBULATORY_CARE_PROVIDER_SITE_OTHER): Payer: 59 | Admitting: Psychiatry

## 2023-05-23 ENCOUNTER — Encounter: Payer: Self-pay | Admitting: Psychiatry

## 2023-05-23 DIAGNOSIS — Z79899 Other long term (current) drug therapy: Secondary | ICD-10-CM | POA: Diagnosis not present

## 2023-05-23 DIAGNOSIS — F319 Bipolar disorder, unspecified: Secondary | ICD-10-CM

## 2023-05-23 DIAGNOSIS — F411 Generalized anxiety disorder: Secondary | ICD-10-CM | POA: Diagnosis not present

## 2023-05-23 DIAGNOSIS — F5105 Insomnia due to other mental disorder: Secondary | ICD-10-CM

## 2023-05-23 MED ORDER — LAMOTRIGINE 200 MG PO TABS: 200.0000 mg | ORAL_TABLET | Freq: Every day | ORAL | 0 refills | Status: DC

## 2023-05-23 MED ORDER — LITHIUM CARBONATE 300 MG PO CAPS
900.0000 mg | ORAL_CAPSULE | Freq: Every day | ORAL | 0 refills | Status: DC
Start: 2023-05-23 — End: 2023-08-24

## 2023-05-23 NOTE — Progress Notes (Signed)
Julian [name] [id number] 24-Mar-1971 52 y.o.  Subjective:   Patient ID:  Julian [name] is a 52 y.o. (DOB 1970/10/06) male.  Chief Complaint:  Chief Complaint  Patient presents with   Follow-up   Depression    HPI Julian [name] presents to the office today for follow-up of adding lithium and reducing lamotrigine.  At the visit in June we increased lithium to 750 mg daily because of a low lithium level and started tapering lamotrigine in hopes that he could be a lithium mono responder.  He was also scheduled to get a lithium level.  At the visit May 16, 2019 the following change was made: Last lithium level was 0.5 which is still at the low end of the normal range. Due to low lithium level rec increase into the normal range by increasing lithium 900 mg daily . After 2 weeks of 50 mg lamotrigine Then stop lamotrigine.  At visit November started buspirone. Weaning off lamotrigine was hard.  Felt bad, tingling fingers, etc.  Lasted a couple of weeks.  November 24 called and wanted to restart lamotrigine at 75 mg today.  Noticed cognitive SE at higher doses.  [name] noticed the benefit too, more positive and relaxed.   seen December 2020.  He stated the following and no meds were changed.  I feel like this is the right combination.  Even noticed restarting 25 mg of lamotrigine he saw benefit. Better cognition with less also of the lamotrigine.  I like the stability I have right now.   As of [name] 30, 2021:  Seen with [name] 12/03/19 called and wanted to increase lamotrigine to 100 bc mixed anxiety and depression.  He thinks the right amount is 150 mg daily.  Saw benefit substantially with the increase.  Still not quite where he wants to be.  Stressful bc changing jobs to new company.  Anxiety and depression is still abnormal.   W noted SOB, poor sleep and a lot of anxiety.  Still a little depressed and irritable. No sig cognitive problems. Plan:  He is not having any mood  swings.  However he is more anxious and a little depressed with low dose of lamotrigine.  However he is cognitively sharper with low-dosel lamotrigine.  Option increase lamotrigine vs lithium.  Disc Se concerns.  Disc pros cons.  Increase lithium to 1200 mg daily. If this fails to improve his residual symptoms of depression and anxiety then we will reduced lithium back to 900 mg daily and increase lamotrigine to either 125 or 150 mg daily. He knows at higher doses of lamotrigine he does have some mild cognitive side effects of it therefore increasing the lithium may prevent that side effect and resolve his symptoms.  January 29, 2020 appointment the following is noted: Took lithium 1200 mg for 9 days bc it didn't helpt the irritability enough or depression.  So increased lamotrigine to 150 mg daily and dropped lithium to 900 mg for the last few weeks.  Been really good since.   This is the best I've felt in a long time.  Tolerated cognitively well.   Still believes the lithium is a wonder drug.  Very stable.  Patient reports stable mood, and less depressed or irritable moods.  Without lamotrigine he has more anxiety and irritability.  Still is more intense and harder to relax than it used to be but mild.  Positive stress of new job and gets up early to go work out.  Light  sleeper.  Patient denies difficulty with sleep initiation or maintenance, but works hard to get enough sleep.  Usually taking Ambien lately and not trazodone.  Sleep fine with it.  Denies appetite disturbance.  Patient reports that energy and motivation have been good.  Patient denies any difficulty with concentration.  Patient denies any suicidal ideation. Lately working hard and more tired the last few days. Option increase lamotrigine vs lithium.  Disc Se concerns.  Disc pros cons.  Continue lithium to 900 mg daily. Continue lamotrigine 150 mg daily.  04/29/20 appt with the following noted: Taking NAC longterm. Really pleased where I  am cognitively, mood and anxiety. Pleased with doses and SE. Patient reports stable mood and denies depressed or irritable moods.  Patient denies any recent difficulty with anxiety.  Patient denies difficulty with sleep initiation or maintenance but chronic worry over sleep. Ambien rarely and trazodone more rare.  Denies appetite disturbance.  Patient reports that energy and motivation have been good.  Patient denies any difficulty with concentration.  Patient denies any suicidal ideation. Recognizes at 200 lamotrigine had sig cog problems.  Wonders about lithium delaying response time. Plan no changes  10/29/20 appt noted: Still doing well.  Wants to have more intensity and engagement.  But happy and content and productive.  Lost a little edge for pushing but it's not enough to make a change.  No concerns from wife.   Sleep great with melatonin 5 mg.  Ambien 2 monthly.  Not a big fan of trazodone. Patient reports stable mood and denies depressed or irritable moods.  Patient denies any recent difficulty with anxiety.  Patient denies difficulty with sleep initiation or maintenance. Denies appetite disturbance.  Patient reports that energy and motivation have been good.  Patient denies any difficulty with concentration.  Patient denies any suicidal ideation. Plan: No med change  12/10/2020 phone call patient wanted to increase lamotrigine from 150 to 175 mg daily.  Okay. 02/01/2021 phone call wanting to increase lamotrigine from 175 mg to 200 mg daily.  Agreed.  03/30/2021 appointment with the following noted: Great now.  Feels he's better witn the increase lamotrigine to 200 mg daily.  Needs lithium and lamotrigine. No cognitive problems with the lamotrigine now.  Disproved by changing doses.  Satisfied. Patient reports stable mood and denies depressed or irritable moods.  Patient denies any recent difficulty with anxiety.  Patient denies difficulty with sleep initiation or maintenance. Denies  appetite disturbance.  Patient reports that energy and motivation have been good.  Patient denies any difficulty with concentration.  Patient denies any suicidal ideation. Tolerating lithium well. Dentist checked him for OSA-  trying retainer despite scoring in the mild range of OSA. New doc Dr. Eric [facility] Plan continue lithium 900 and lamotrigine 200 mg daily and get lithium labs  09/21/2021 appt noted: Overall pretty well.  Over last 5 years or so, increased anxiety.  It is intermittently difficult to deal with.  Persistent nagging anxiety for a period of time.  Can interfere with problem solving and seeing the big picture. Got mouthpiece for OSA and sleep quality is much better with better alertness.   Patient reports stable mood and denies depressed or irritable moods.   Patient denies difficulty with sleep initiation or maintenance. Denies appetite disturbance.  Patient reports that energy and motivation have been good.  Patient denies any difficulty with concentration.  Patient denies any suicidal ideation. Cycles of anxiety a week out of the month. Plan  no changes  03/22/22 appt noted: Good and very steady.  No swings. Patient reports stable mood and denies depressed or irritable moods.  Patient denies any recent difficulty with anxiety.  Patient denies difficulty with sleep initiation or maintenance. Denies appetite disturbance.  Patient reports that energy and motivation have been good.  Patient denies any difficulty with concentration.  Patient denies any suicidal ideation. Sleep is pretty good. Not as good in winter  09/20/22 appt noted: TSH 4.39.  [facility]  Dr. Clelia Croft.  Annual check up.   Notices cold intolerance.  Gained a little weight.   Wonders if lithium is the cause.  Mood is really good.  Years past sometimes holidays will cause mood instability and now just as steady as he could be.  Maybe a little unmotivated and lethargic at times.  Pretty good  at work.  Good annual review.   Plan: Continue lithium to 900 mg daily. Continue lamotrigine 200 mg daily.  03/22/23 appt noted: Late winter to May wwas a bit dep and lacking motivation to do things other than exercises/sports he likes.  Wife notices.  Ex Spring Break to [facility] and didn't enjoy it. No strong seasonal pattern noted except more active and outside in Spring and summer.   Shorter with  kids than 2 yr ago, irritable. Plan: Start Wellbutrin incr to 300 AM  05/12/23 TC:  Patient reporting that bupropion was fantastic for about 3 weeks but there is no longer any oomph to it. He is taking 300 mg, said you told him an increase was possible. He has FU 8/20, but would like to increase dose.     MD resp: ok increase Wellbutrin XL to 450AM  05/23/23 appt noted: Meds: Wellbutrin XL 450 AM, lamotrigine 200 mg daily, lithium 900 mg daily, Ambien 10 prn rarely. Noticed benefit right away with Wellbutrin and then seemed to lose some benefit but regained it with increase in Wellbutrin.  This is better state than where I was.   Feels good about the current dose.  Sleep is good generally. No SE with it. Always irritable next day after Ambien whether 5-10 mg HS.  But it does work for initial insomnia.   Exercise helps. Works from home.  Wife [name].  Past Psychiatric Medication Trials: Lithium,  lamotrigine 200 mg cognitive side effect, risperidone, fluvoxamine,   buspirone SE, Ambien, trazodone  2 prior significant manic episodes  M his hypothyroidism   .Review of Systems:  Review of Systems  Gastrointestinal:  Negative for diarrhea and nausea.  Neurological:  Negative for dizziness and tremors.  Psychiatric/Behavioral:  Negative for agitation, behavioral problems, confusion, decreased concentration, dysphoric mood, hallucinations, self-injury, sleep disturbance and suicidal ideas. The patient is not nervous/anxious and is not hyperactive.     Medications: I have reviewed the  patient's current medications.  Current Outpatient Medications  Medication Sig Dispense Refill   Acetylcysteine 600 MG CAPS Take 1,200 mg by mouth daily.     b complex vitamins tablet Take 1 tablet by mouth daily.     buPROPion (WELLBUTRIN XL) 150 MG 24 hr tablet TAKE 3 TABLETS BY MOUTH DAILY. 270 tablet 0   cholecalciferol (VITAMIN D3) 25 MCG (1000 UT) tablet Take 1,000 Units by mouth daily.     lamoTRIgine (LAMICTAL) 200 MG tablet TAKE 1 TABLET DAILY 90 tablet 0   lithium carbonate 300 MG capsule TAKE 3 CAPSULES (900 MG TOTAL) BY MOUTH DAILY. 270 capsule 0   magnesium 30 MG tablet Take  30 mg by mouth daily.     Omega-3 Fatty Acids (FISH OIL) 1000 MG CAPS Take by mouth.     Probiotic Product (PROBIOTIC-10 PO) Take by mouth.     zolpidem (AMBIEN) 10 MG tablet Take 1/2-1 tablet QHS PRN insomnia 30 tablet 0   No current facility-administered medications for this visit.    Medication Side Effects: dry mouth.  No tremor nor diarrhea.  ? Mild dulling or lethargy  Allergies: No Known Allergies  History reviewed. No pertinent past medical history.  History reviewed. No pertinent family history.  Social History   Socioeconomic History   Marital status: Married    Spouse name: Not on file   Number of children: Not on file   Years of education: Not on file   Highest education level: Not on file  Occupational History   Not on file  Tobacco Use   Smoking status: Never   Smokeless tobacco: Never  Substance and Sexual Activity   Alcohol use: Not on file   Drug use: Not on file   Sexual activity: Not on file  Other Topics Concern   Not on file  Social History Narrative   Not on file   Social Determinants of Health   Financial Resource Strain: Not on file  Food Insecurity: Not on file  Transportation Needs: Not on file  Physical Activity: Not on file  Stress: Not on file  Social Connections: Not on file  Intimate Partner Violence: Not on file    Past Medical History,  Surgical history, Social history, and Family history were reviewed and updated as appropriate.   Please see review of systems for further details on the patient's review from today.   Objective:   Physical Exam:  There were no vitals taken for this visit.  Physical Exam Constitutional:      General: He is not in acute distress. Musculoskeletal:        General: No deformity.  Neurological:     Mental Status: He is alert and oriented to person, place, and time.     Coordination: Coordination normal.     Gait: Gait normal.  Psychiatric:        Attention and Perception: He is attentive.        Mood and Affect: Mood is not anxious or depressed. Affect is not labile, blunt, tearful or inappropriate.        Speech: Speech normal. Speech is not rapid and pressured or slurred.        Behavior: Behavior normal. Behavior is not slowed.        Thought Content: Thought content normal. Thought content is not delusional. Thought content does not include homicidal or suicidal ideation. Thought content does not include suicidal plan.        Cognition and Memory: Cognition normal.        Judgment: Judgment normal.     Comments: Insight intact. No auditory or visual hallucinations. No delusions. .      Lab Review:     Component Value Date/Time   NA 138 05/26/2020 0820   K 4.1 05/26/2020 0820   CL 103 05/26/2020 0820   CO2 28 05/26/2020 0820   GLUCOSE 111 05/26/2020 0820   BUN 19 05/26/2020 0820   CREATININE 1.15 05/26/2020 0820   CALCIUM 9.5 05/26/2020 0820    No results found for: "WBC", "RBC", "HGB", "HCT", "PLT", "MCV", "MCH", "MCHC", "RDW", "LYMPHSABS", "MONOABS", "EOSABS", "BASOSABS"  Lithium Lvl  Date Value Ref Range Status  03/24/2022 0.6 0.6 - 1.2 mmol/L Final     No results found for: "PHENYTOIN", "PHENOBARB", "VALPROATE", "CBMZ"   Dr. Clelia Croft 08/03/2021 lithium level 0.8 creatinine 1.1 calcium 8.4, TSH 3.6 09/2022 Lithium 0.6.     Marland Kitchenres Assessment: Plan:    Yago was  seen today for follow-up and depression.  Diagnoses and all orders for this visit:  Bipolar I disorder (HCC)  Generalized anxiety disorder  Insomnia due to mental condition  Lithium use      30-minute face to face time with patient was spent on counseling and coordination of care. We discussed the following: He is not having any mood swings.   He feels his mood is better on lamotrigine 200 mg daily and there are no significant cognitive side effects at this dose. Further boost in mood andd well-being after adding Wellbutrin 450 AM so far.  Had gotten tolerant of 300 mg daily.  Option retry buspirone vs BZ discussed. For residual anxiety.  He'd like to try buspirone again.  Disc how to manage dosing and prior nausea problems with buspirone.  Disc sleep hygiene and usig light blocking masks.  Sleep masks disc previously.  Continue lithium to 900 mg daily. Continue lamotrigine 200 mg daily. He knows higher doses of lamotrigine can cause mild cognitive side effects   Counseled patient regarding potential benefits, risks, and side effects of lithium to include potential risk of lithium affecting thyroid and renal function.  Discussed need for periodic lab monitoring to determine drug level and to assess for potential adverse effects.  Counseled patient regarding signs and symptoms of lithium toxicity and advised that they notify office immediately or seek urgent medical attention if experiencing these signs and symptoms.  Patient advised to contact office with any questions or concerns.   Disc example of lithium toxicity and dehydration and DDI. Disc borderline TSH from PCP.   Extensive discussion.  He'll repeat in 3 mos.  Disc difference between T3 and T4.  Repeat iper PCP Repeat lithium level when gets labs with PCP  continue the NAC.  He is cognitively improved with the NAC.  Better with incr Wellbutrin incr to 450 AM.  Got tolerant at 300 mg daily. Other pramipexole or Vraylar,  etc  This appt was 30 mins.  FU 2 -3 mos  Meredith Staggers, MD, DFAPA  Please see After Visit Summary for patient specific instructions.  No future appointments.   No orders of the defined types were placed in this encounter.      -------------------------------

## 2023-06-04 ENCOUNTER — Other Ambulatory Visit: Payer: Self-pay | Admitting: Psychiatry

## 2023-06-04 DIAGNOSIS — F319 Bipolar disorder, unspecified: Secondary | ICD-10-CM

## 2023-07-25 ENCOUNTER — Other Ambulatory Visit: Payer: Self-pay | Admitting: Psychiatry

## 2023-07-25 DIAGNOSIS — F319 Bipolar disorder, unspecified: Secondary | ICD-10-CM

## 2023-07-25 NOTE — Telephone Encounter (Signed)
Rf inappropriate. LV 8/20 Wellbutrin was changed to 150 MG TID.

## 2023-08-12 ENCOUNTER — Other Ambulatory Visit: Payer: Self-pay | Admitting: Psychiatry

## 2023-08-24 ENCOUNTER — Encounter: Payer: Self-pay | Admitting: Psychiatry

## 2023-08-24 ENCOUNTER — Ambulatory Visit: Payer: 59 | Admitting: Psychiatry

## 2023-08-24 DIAGNOSIS — Z79899 Other long term (current) drug therapy: Secondary | ICD-10-CM | POA: Diagnosis not present

## 2023-08-24 DIAGNOSIS — F319 Bipolar disorder, unspecified: Secondary | ICD-10-CM

## 2023-08-24 DIAGNOSIS — F411 Generalized anxiety disorder: Secondary | ICD-10-CM

## 2023-08-24 DIAGNOSIS — F5105 Insomnia due to other mental disorder: Secondary | ICD-10-CM

## 2023-08-24 MED ORDER — BUPROPION HCL ER (XL) 300 MG PO TB24: 300.0000 mg | ORAL_TABLET | Freq: Every day | ORAL | 1 refills | Status: DC

## 2023-08-24 MED ORDER — LITHIUM CARBONATE 300 MG PO CAPS: 900.0000 mg | ORAL_CAPSULE | Freq: Every day | ORAL | 1 refills | Status: DC

## 2023-08-24 MED ORDER — LAMOTRIGINE 200 MG PO TABS: 200.0000 mg | ORAL_TABLET | Freq: Every day | ORAL | 1 refills | Status: DC

## 2023-08-24 NOTE — Progress Notes (Signed)
Julian [name] [id number] 1971/04/09 52 y.o.  Subjective:   Patient ID:  Julian [name] is a 52 y.o. (DOB 10-Jun-1971) male.  Chief Complaint:  Chief Complaint  Patient presents with   Follow-up   Depression   Anxiety    HPI Julian [name] presents to the office today for follow-up of adding lithium and reducing lamotrigine.  At the visit in June we increased lithium to 750 mg daily because of a low lithium level and started tapering lamotrigine in hopes that he could be a lithium mono responder.  He was also scheduled to get a lithium level.  At the visit May 16, 2019 the following change was made: Last lithium level was 0.5 which is still at the low end of the normal range. Due to low lithium level rec increase into the normal range by increasing lithium 900 mg daily . After 2 weeks of 50 mg lamotrigine Then stop lamotrigine.  At visit November started buspirone. Weaning off lamotrigine was hard.  Felt bad, tingling fingers, etc.  Lasted a couple of weeks.  November 24 called and wanted to restart lamotrigine at 75 mg today.  Noticed cognitive SE at higher doses.  [name] noticed the benefit too, more positive and relaxed.   seen December 2020.  He stated the following and no meds were changed.  I feel like this is the right combination.  Even noticed restarting 25 mg of lamotrigine he saw benefit. Better cognition with less also of the lamotrigine.  I like the stability I have right now.   As of [name] 30, 2021:  Seen with [name] 12/03/19 called and wanted to increase lamotrigine to 100 bc mixed anxiety and depression.  He thinks the right amount is 150 mg daily.  Saw benefit substantially with the increase.  Still not quite where he wants to be.  Stressful bc changing jobs to new company.  Anxiety and depression is still abnormal.   W noted SOB, poor sleep and a lot of anxiety.  Still a little depressed and irritable. No sig cognitive problems. Plan:  He is not having any  mood swings.  However he is more anxious and a little depressed with low dose of lamotrigine.  However he is cognitively sharper with low-dosel lamotrigine.  Option increase lamotrigine vs lithium.  Disc Se concerns.  Disc pros cons.  Increase lithium to 1200 mg daily. If this fails to improve his residual symptoms of depression and anxiety then we will reduced lithium back to 900 mg daily and increase lamotrigine to either 125 or 150 mg daily. He knows at higher doses of lamotrigine he does have some mild cognitive side effects of it therefore increasing the lithium may prevent that side effect and resolve his symptoms.  January 29, 2020 appointment the following is noted: Took lithium 1200 mg for 9 days bc it didn't helpt the irritability enough or depression.  So increased lamotrigine to 150 mg daily and dropped lithium to 900 mg for the last few weeks.  Been really good since.   This is the best I've felt in a long time.  Tolerated cognitively well.   Still believes the lithium is a wonder drug.  Very stable.  Patient reports stable mood, and less depressed or irritable moods.  Without lamotrigine he has more anxiety and irritability.  Still is more intense and harder to relax than it used to be but mild.  Positive stress of new job and gets up early to go work  out.  [name] sleeper.  Patient denies difficulty with sleep initiation or maintenance, but works hard to get enough sleep.  Usually taking Ambien lately and not trazodone.  Sleep fine with it.  Denies appetite disturbance.  Patient reports that energy and motivation have been good.  Patient denies any difficulty with concentration.  Patient denies any suicidal ideation. Lately working hard and more tired the last few days. Option increase lamotrigine vs lithium.  Disc Se concerns.  Disc pros cons.  Continue lithium to 900 mg daily. Continue lamotrigine 150 mg daily.  04/29/20 appt with the following noted: Taking NAC longterm. Really pleased  where I am cognitively, mood and anxiety. Pleased with doses and SE. Patient reports stable mood and denies depressed or irritable moods.  Patient denies any recent difficulty with anxiety.  Patient denies difficulty with sleep initiation or maintenance but chronic worry over sleep. Ambien rarely and trazodone more rare.  Denies appetite disturbance.  Patient reports that energy and motivation have been good.  Patient denies any difficulty with concentration.  Patient denies any suicidal ideation. Recognizes at 200 lamotrigine had sig cog problems.  Wonders about lithium delaying response time. Plan no changes  10/29/20 appt noted: Still doing well.  Wants to have more intensity and engagement.  But happy and content and productive.  Lost a little edge for pushing but it's not enough to make a change.  No concerns from wife.   Sleep great with melatonin 5 mg.  Ambien 2 monthly.  Not a big fan of trazodone. Patient reports stable mood and denies depressed or irritable moods.  Patient denies any recent difficulty with anxiety.  Patient denies difficulty with sleep initiation or maintenance. Denies appetite disturbance.  Patient reports that energy and motivation have been good.  Patient denies any difficulty with concentration.  Patient denies any suicidal ideation. Plan: No med change  12/10/2020 phone call patient wanted to increase lamotrigine from 150 to 175 mg daily.  Okay. 02/01/2021 phone call wanting to increase lamotrigine from 175 mg to 200 mg daily.  Agreed.  03/30/2021 appointment with the following noted: Great now.  Feels he's better witn the increase lamotrigine to 200 mg daily.  Needs lithium and lamotrigine. No cognitive problems with the lamotrigine now.  Disproved by changing doses.  Satisfied. Patient reports stable mood and denies depressed or irritable moods.  Patient denies any recent difficulty with anxiety.  Patient denies difficulty with sleep initiation or maintenance. Denies  appetite disturbance.  Patient reports that energy and motivation have been good.  Patient denies any difficulty with concentration.  Patient denies any suicidal ideation. Tolerating lithium well. Dentist checked him for OSA-  trying retainer despite scoring in the mild range of OSA. New doc Dr. Eric Form [facility] Plan continue lithium 900 and lamotrigine 200 mg daily and get lithium labs  09/21/2021 appt noted: Overall pretty well.  Over last 5 years or so, increased anxiety.  It is intermittently difficult to deal with.  Persistent nagging anxiety for a period of time.  Can interfere with problem solving and seeing the big picture. Got mouthpiece for OSA and sleep quality is much better with better alertness.   Patient reports stable mood and denies depressed or irritable moods.   Patient denies difficulty with sleep initiation or maintenance. Denies appetite disturbance.  Patient reports that energy and motivation have been good.  Patient denies any difficulty with concentration.  Patient denies any suicidal ideation. Cycles of anxiety a week out of  the month. Plan no changes  03/22/22 appt noted: Good and very steady.  No swings. Patient reports stable mood and denies depressed or irritable moods.  Patient denies any recent difficulty with anxiety.  Patient denies difficulty with sleep initiation or maintenance. Denies appetite disturbance.  Patient reports that energy and motivation have been good.  Patient denies any difficulty with concentration.  Patient denies any suicidal ideation. Sleep is pretty good. Not as good in winter  09/20/22 appt noted: TSH 4.39.  [facility]  Dr. Clelia Croft.  Annual check up.   Notices cold intolerance.  Gained a little weight.   Wonders if lithium is the cause.  Mood is really good.  Years past sometimes holidays will cause mood instability and now just as steady as he could be.  Maybe a little unmotivated and lethargic at times.  Pretty good  at work.  Good annual review.   Plan: Continue lithium to 900 mg daily. Continue lamotrigine 200 mg daily.  03/22/23 appt noted: Late winter to May wwas a bit dep and lacking motivation to do things other than exercises/sports he likes.  Wife notices.  Ex Spring Break to [facility] and didn't enjoy it. No strong seasonal pattern noted except more active and outside in Spring and summer.   Shorter with  kids than 2 yr ago, irritable. Plan: Start Wellbutrin incr to 300 AM  05/12/23 TC:  Patient reporting that bupropion was fantastic for about 3 weeks but there is no longer any oomph to it. He is taking 300 mg, said you told him an increase was possible. He has FU 8/20, but would like to increase dose.     MD resp: ok increase Wellbutrin XL to 450AM  05/23/23 appt noted: Meds: Wellbutrin XL 450 AM, lamotrigine 200 mg daily, lithium 900 mg daily, Ambien 10 prn rarely. Noticed benefit right away with Wellbutrin and then seemed to lose some benefit but regained it with increase in Wellbutrin.  This is better state than where I was.   Feels good about the current dose.  Sleep is good generally. No SE with it. Always irritable next day after Ambien whether 5-10 mg HS.  But it does work for initial insomnia.  08/24/23 appt noted: Meds: Wellbutrin XL 300 AM, lamotrigine 200 mg daily, lithium 900 mg daily, Ambien 10 prn rarely. Reduced Wellbutrin to 300 bc jittery and anxious at 450 AM right after here.  Overall postive mood but still not as much motivation and energy as he'd like.  Not a big difference at 450 mg daily.  Overall satisfied with meds.   Not depressed.  No concerns from family nor coworkers.   Sleep pretty good usually.  Ambien helps when needed.    Exercise helps. Works from home.  [name].  Past Psychiatric Medication Trials: Lithium,  lamotrigine 200 mg cognitive side effect, risperidone, fluvoxamine,    Wellbutrin 450 jittery buspirone SE, Ambien, trazodone  2 prior  significant manic episodes  M his hypothyroidism   .Review of Systems:  Review of Systems  Gastrointestinal:  Negative for diarrhea and nausea.  Neurological:  Negative for dizziness, tremors and weakness.  Psychiatric/Behavioral:  Negative for agitation, behavioral problems, confusion, decreased concentration, dysphoric mood, hallucinations, self-injury, sleep disturbance and suicidal ideas. The patient is not nervous/anxious and is not hyperactive.     Medications: I have reviewed the patient's current medications.  Current Outpatient Medications  Medication Sig Dispense Refill   Acetylcysteine 600 MG CAPS Take 1,200 mg by  mouth daily.     b complex vitamins tablet Take 1 tablet by mouth daily.     cholecalciferol (VITAMIN D3) 25 MCG (1000 UT) tablet Take 1,000 Units by mouth daily.     magnesium 30 MG tablet Take 30 mg by mouth daily.     Omega-3 Fatty Acids (FISH OIL) 1000 MG CAPS Take by mouth.     Probiotic Product (PROBIOTIC-10 PO) Take by mouth.     zolpidem (AMBIEN) 10 MG tablet Take 1/2-1 tablet QHS PRN insomnia 30 tablet 0   buPROPion (WELLBUTRIN XL) 300 MG 24 hr tablet Take 1 tablet (300 mg total) by mouth daily. 90 tablet 1   lamoTRIgine (LAMICTAL) 200 MG tablet Take 1 tablet (200 mg total) by mouth daily. 90 tablet 1   lithium carbonate 300 MG capsule Take 3 capsules (900 mg total) by mouth daily. 270 capsule 1   No current facility-administered medications for this visit.    Medication Side Effects: dry mouth.  No tremor nor diarrhea.  ? Mild dulling or lethargy  Allergies: No Known Allergies  History reviewed. No pertinent past medical history.  History reviewed. No pertinent family history.  Social History   Socioeconomic History   Marital status: Married    Spouse name: Not on file   Number of children: Not on file   Years of education: Not on file   Highest education level: Not on file  Occupational History   Not on file  Tobacco Use   Smoking  status: Never   Smokeless tobacco: Never  Substance and Sexual Activity   Alcohol use: Not on file   Drug use: Not on file   Sexual activity: Not on file  Other Topics Concern   Not on file  Social History Narrative   Not on file   Social Determinants of Health   Financial Resource Strain: Not on file  Food Insecurity: Not on file  Transportation Needs: Not on file  Physical Activity: Not on file  Stress: Not on file  Social Connections: Not on file  Intimate Partner Violence: Not on file    Past Medical History, Surgical history, Social history, and Family history were reviewed and updated as appropriate.   Please see review of systems for further details on the patient's review from today.   Objective:   Physical Exam:  There were no vitals taken for this visit.  Physical Exam Constitutional:      General: He is not in acute distress. Musculoskeletal:        General: No deformity.  Neurological:     Mental Status: He is alert and oriented to person, place, and time.     Coordination: Coordination normal.     Gait: Gait normal.  Psychiatric:        Attention and Perception: He is attentive.        Mood and Affect: Mood is not anxious or depressed. Affect is not labile, blunt or inappropriate.        Speech: Speech normal. Speech is not rapid and pressured or slurred.        Behavior: Behavior normal. Behavior is not slowed.        Thought Content: Thought content normal. Thought content is not delusional. Thought content does not include homicidal or suicidal ideation. Thought content does not include suicidal plan.        Cognition and Memory: Cognition normal.        Judgment: Judgment normal.     Comments:  Insight intact. No auditory or visual hallucinations. No delusions. .      Lab Review:     Component Value Date/Time   NA 138 05/26/2020 0820   K 4.1 05/26/2020 0820   CL 103 05/26/2020 0820   CO2 28 05/26/2020 0820   GLUCOSE 111 05/26/2020 0820    BUN 19 05/26/2020 0820   CREATININE 1.15 05/26/2020 0820   CALCIUM 9.5 05/26/2020 0820    No results found for: "WBC", "RBC", "HGB", "HCT", "PLT", "MCV", "MCH", "MCHC", "RDW", "LYMPHSABS", "MONOABS", "EOSABS", "BASOSABS"  Lithium Lvl  Date Value Ref Range Status  03/24/2022 0.6 0.6 - 1.2 mmol/L Final     No results found for: "PHENYTOIN", "PHENOBARB", "VALPROATE", "CBMZ"   Dr. Clelia Croft 08/03/2021 lithium level 0.8 creatinine 1.1 calcium 8.4, TSH 3.6 09/2022 Lithium 0.6.     Marland Kitchenres Assessment: Plan:    Julian [name] was seen today for follow-up, depression and anxiety.  Diagnoses and all orders for this visit:  Bipolar I disorder (HCC) -     lamoTRIgine (LAMICTAL) 200 MG tablet; Take 1 tablet (200 mg total) by mouth daily. -     lithium carbonate 300 MG capsule; Take 3 capsules (900 mg total) by mouth daily.  Generalized anxiety disorder  Insomnia due to mental condition  Lithium use  Other orders -     buPROPion (WELLBUTRIN XL) 300 MG 24 hr tablet; Take 1 tablet (300 mg total) by mouth daily.   30-minute face to face time with patient was spent on counseling and coordination of care. We discussed the following: He is not having any mood swings.   He feels his mood is better on lamotrigine 200 mg daily and there are no significant cognitive side effects at this dose. Further boost in mood andd well-being after adding Wellbutrin 450 AM so far.  Had gotten tolerant of 300 mg daily.  Disc sleep hygiene and usig light blocking masks.  Sleep masks disc previously.  Continue lithium to 900 mg daily. Continue lamotrigine 200 mg daily. He knows higher doses of lamotrigine can cause mild cognitive side effects   Counseled patient regarding potential benefits, risks, and side effects of lithium to include potential risk of lithium affecting thyroid and renal function.  Discussed need for periodic lab monitoring to determine drug level and to assess for potential adverse effects.  Counseled  patient regarding signs and symptoms of lithium toxicity and advised that they notify office immediately or seek urgent medical attention if experiencing these signs and symptoms.  Patient advised to contact office with any questions or concerns.   Disc example of lithium toxicity and dehydration and DDI. Reviewed this again with signs sx toxicity  Disc borderline TSH from PCP.   Extensive discussion.  He'll repeat in 3 mos.  Disc difference between T3 and T4.  Repeat iper PCP Repeat lithium level when gets labs with PCP Dr. Eric Form in Dec.   continue the NAC.  He is cognitively improved with the NAC.  reduced Wellbutrin incr to 300 AM DT jitteriness Other pramipexole or Vraylar, etc  option [place] Option retry buspirone vs BZ discussed. For residual anxiety.  He'd like to try buspirone again.  Disc how to manage dosing and prior nausea problems with buspirone.  This appt was 30 mins.  FU 6 mos  Meredith Staggers, MD, DFAPA  Please see After Visit Summary for patient specific instructions.  No future appointments.   No orders of the defined types were placed in this encounter.      -------------------------------

## 2024-01-12 ENCOUNTER — Other Ambulatory Visit: Payer: Self-pay | Admitting: Psychiatry

## 2024-01-12 DIAGNOSIS — F319 Bipolar disorder, unspecified: Secondary | ICD-10-CM

## 2024-01-15 ENCOUNTER — Other Ambulatory Visit: Payer: Self-pay | Admitting: Psychiatry

## 2024-01-15 DIAGNOSIS — F319 Bipolar disorder, unspecified: Secondary | ICD-10-CM

## 2024-01-25 ENCOUNTER — Ambulatory Visit: Payer: 59 | Admitting: Psychiatry

## 2024-01-30 ENCOUNTER — Encounter: Payer: Self-pay | Admitting: Psychiatry

## 2024-01-30 ENCOUNTER — Ambulatory Visit (INDEPENDENT_AMBULATORY_CARE_PROVIDER_SITE_OTHER): Payer: 59 | Admitting: Psychiatry

## 2024-01-30 DIAGNOSIS — F319 Bipolar disorder, unspecified: Secondary | ICD-10-CM | POA: Diagnosis not present

## 2024-01-30 DIAGNOSIS — F5105 Insomnia due to other mental disorder: Secondary | ICD-10-CM

## 2024-01-30 DIAGNOSIS — Z79899 Other long term (current) drug therapy: Secondary | ICD-10-CM

## 2024-01-30 DIAGNOSIS — F411 Generalized anxiety disorder: Secondary | ICD-10-CM

## 2024-01-30 MED ORDER — BUPROPION HCL ER (XL) 300 MG PO TB24: 300.0000 mg | ORAL_TABLET | Freq: Every day | ORAL | 1 refills | Status: DC

## 2024-01-30 MED ORDER — LAMOTRIGINE 200 MG PO TABS: 200.0000 mg | ORAL_TABLET | Freq: Every day | ORAL | 1 refills | Status: DC

## 2024-01-30 MED ORDER — LITHIUM CARBONATE 300 MG PO CAPS: 900.0000 mg | ORAL_CAPSULE | Freq: Every day | ORAL | 1 refills | Status: DC

## 2024-01-30 MED ORDER — ZOLPIDEM TARTRATE 10 MG PO TABS: ORAL_TABLET | ORAL | 3 refills | Status: DC

## 2024-01-30 NOTE — Progress Notes (Signed)
 Julian [name] [id number] March 08, 1971 53 y.o.  Subjective:   Patient ID:  Julian [name] is a 53 y.o. (DOB 1971/06/28) male.  Chief Complaint:  No chief complaint on file.   HPI Julian [name] presents to the office today for follow-up of adding lithium  and reducing lamotrigine .  At the visit in June we increased lithium  to 750 mg daily because of a low lithium  level and started tapering lamotrigine  in hopes that he could be a lithium  mono responder.  He was also scheduled to get a lithium  level.  At the visit May 16, 2019 the following change was made: Last lithium  level was 0.5 which is still at the low end of the normal range. Due to low lithium  level rec increase into the normal range by increasing lithium  900 mg daily . After 2 weeks of 50 mg lamotrigine  Then stop lamotrigine .  At visit November started buspirone . Weaning off lamotrigine  was hard.  Felt bad, tingling fingers, etc.  Lasted a couple of weeks.  November 24 called and wanted to restart lamotrigine  at 75 mg today.  Noticed cognitive SE at higher doses.  [name] noticed the benefit too, more positive and relaxed.   seen December 2020.  He stated the following and no meds were changed.  I feel like this is the right combination.  Even noticed restarting 25 mg of lamotrigine  he saw benefit. Better cognition with less also of the lamotrigine .  I like the stability I have right now.   As of December 31, 2019:  Seen with [name] 12/03/19 called and wanted to increase lamotrigine  to 100 bc mixed anxiety and depression.  He thinks the right amount is 150 mg daily.  Saw benefit substantially with the increase.  Still not quite where he wants to be.  Stressful bc changing jobs to new company.  Anxiety and depression is still abnormal.   W noted SOB, poor sleep and a lot of anxiety.  Still a little depressed and irritable. No sig cognitive problems. Plan:  He is not having any mood swings.  However he is more anxious and a  little depressed with low dose of lamotrigine .  However he is cognitively sharper with low-dosel lamotrigine .  Option increase lamotrigine  vs lithium .  Disc Se concerns.  Disc pros cons.  Increase lithium  to 1200 mg daily. If this fails to improve his residual symptoms of depression and anxiety then we will reduced lithium  back to 900 mg daily and increase lamotrigine  to either 125 or 150 mg daily. He knows at higher doses of lamotrigine  he does have some mild cognitive side effects of it therefore increasing the lithium  may prevent that side effect and resolve his symptoms.  January 29, 2020 appointment the following is noted: Took lithium  1200 mg for 9 days bc it didn't helpt the irritability enough or depression.  So increased lamotrigine  to 150 mg daily and dropped lithium  to 900 mg for the last few weeks.  Been really good since.   This is the best I've felt in a long time.  Tolerated cognitively well.   Still believes the lithium  is a wonder drug.  Very stable.  Patient reports stable mood, and less depressed or irritable moods.  Without lamotrigine  he has more anxiety and irritability.  Still is more intense and harder to relax than it used to be but mild.  Positive stress of new job and gets up early to go work out.  Light sleeper.  Patient denies difficulty with sleep initiation  or maintenance, but works hard to get enough sleep.  Usually taking Ambien  lately and not trazodone.  Sleep fine with it.  Denies appetite disturbance.  Patient reports that energy and motivation have been good.  Patient denies any difficulty with concentration.  Patient denies any suicidal ideation. Lately working hard and more tired the last few days. Option increase lamotrigine  vs lithium .  Disc Se concerns.  Disc pros cons.  Continue lithium  to 900 mg daily. Continue lamotrigine  150 mg daily.  04/29/20 appt with the following noted: Taking NAC longterm. Really pleased where I am cognitively, mood and  anxiety. Pleased with doses and SE. Patient reports stable mood and denies depressed or irritable moods.  Patient denies any recent difficulty with anxiety.  Patient denies difficulty with sleep initiation or maintenance but chronic worry over sleep. Ambien  rarely and trazodone more rare.  Denies appetite disturbance.  Patient reports that energy and motivation have been good.  Patient denies any difficulty with concentration.  Patient denies any suicidal ideation. Recognizes at 200 lamotrigine  had sig cog problems.  Wonders about lithium  delaying response time. Plan no changes  10/29/20 appt noted: Still doing well.  Wants to have more intensity and engagement.  But happy and content and productive.  Lost a little edge for pushing but it's not enough to make a change.  No concerns from wife.   Sleep great with melatonin 5 mg.  Ambien  2 monthly.  Not a big fan of trazodone. Patient reports stable mood and denies depressed or irritable moods.  Patient denies any recent difficulty with anxiety.  Patient denies difficulty with sleep initiation or maintenance. Denies appetite disturbance.  Patient reports that energy and motivation have been good.  Patient denies any difficulty with concentration.  Patient denies any suicidal ideation. Plan: No med change  12/10/2020 phone call patient wanted to increase lamotrigine  from 150 to 175 mg daily.  Okay. 02/01/2021 phone call wanting to increase lamotrigine  from 175 mg to 200 mg daily.  Agreed.  03/30/2021 appointment with the following noted: Great now.  Feels he's better witn the increase lamotrigine  to 200 mg daily.  Needs lithium  and lamotrigine . No cognitive problems with the lamotrigine  now.  Disproved by changing doses.  Satisfied. Patient reports stable mood and denies depressed or irritable moods.  Patient denies any recent difficulty with anxiety.  Patient denies difficulty with sleep initiation or maintenance. Denies appetite disturbance.  Patient  reports that energy and motivation have been good.  Patient denies any difficulty with concentration.  Patient denies any suicidal ideation. Tolerating lithium  well. Dentist checked him for OSA-  trying retainer despite scoring in the mild range of OSA. New doc Dr. Jeana Michaels [facility] Plan continue lithium  900 and lamotrigine  200 mg daily and get lithium  labs  09/21/2021 appt noted: Overall pretty well.  Over last 5 years or so, increased anxiety.  It is intermittently difficult to deal with.  Persistent nagging anxiety for a period of time.  Can interfere with problem solving and seeing the big picture. Got mouthpiece for OSA and sleep quality is much better with better alertness.   Patient reports stable mood and denies depressed or irritable moods.   Patient denies difficulty with sleep initiation or maintenance. Denies appetite disturbance.  Patient reports that energy and motivation have been good.  Patient denies any difficulty with concentration.  Patient denies any suicidal ideation. Cycles of anxiety a week out of the month. Plan no changes  03/22/22 appt noted: Good and  very steady.  No swings. Patient reports stable mood and denies depressed or irritable moods.  Patient denies any recent difficulty with anxiety.  Patient denies difficulty with sleep initiation or maintenance. Denies appetite disturbance.  Patient reports that energy and motivation have been good.  Patient denies any difficulty with concentration.  Patient denies any suicidal ideation. Sleep is pretty good. Not as good in winter  09/20/22 appt noted: TSH 4.39.  [facility]  Dr. Bernetta Brilliant.  Annual check up.   Notices cold intolerance.  Gained a little weight.   Wonders if lithium  is the cause.  Mood is really good.  Years past sometimes holidays will cause mood instability and now just as steady as he could be.  Maybe a little unmotivated and lethargic at times.  Pretty good at work.  Good annual review.    Plan: Continue lithium  to 900 mg daily. Continue lamotrigine  200 mg daily.  03/22/23 appt noted: Late winter to May wwas a bit dep and lacking motivation to do things other than exercises/sports he likes.  Wife notices.  Ex Spring Break to [facility] and didn't enjoy it. No strong seasonal pattern noted except more active and outside in Spring and summer.   Shorter with  kids than 2 yr ago, irritable. Plan: Start Wellbutrin  incr to 300 AM  05/12/23 TC:  Patient reporting that bupropion  was fantastic for about 3 weeks but there is no longer any oomph to it. He is taking 300 mg, said you told him an increase was possible. He has FU 8/20, but would like to increase dose.     MD resp: ok increase Wellbutrin  XL to 450AM  05/23/23 appt noted: Meds: Wellbutrin  XL 450 AM, lamotrigine  200 mg daily, lithium  900 mg daily, Ambien  10 prn rarely. Noticed benefit right away with Wellbutrin  and then seemed to lose some benefit but regained it with increase in Wellbutrin .  This is better state than where I was.   Feels good about the current dose.  Sleep is good generally. No SE with it. Always irritable next day after Ambien  whether 5-10 mg HS.  But it does work for initial insomnia.  08/24/23 appt noted: Meds: Wellbutrin  XL 300 AM, lamotrigine  200 mg daily, lithium  900 mg daily, Ambien  10 prn rarely. Reduced Wellbutrin  to 300 bc jittery and anxious at 450 AM right after here.  Overall postive mood but still not as much motivation and energy as he'd like.  Not a big difference at 450 mg daily.  Overall satisfied with meds.   Not depressed.  No concerns from family nor coworkers.   Sleep pretty good usually.  Ambien  helps when needed.    01/30/24 appt noted: Med: Meds: Wellbutrin  XL 300 AM, lamotrigine  200 mg daily, lithium  900 mg daily, Ambien  10 prn rarely. Still low thyroid until lately.  Levothyroxione helped 30% when added.   No sig dep.  Minimal cycling.  Better with meds.   Ambien  for a couple weeks.   Great time in [place].  Work stressful .  Affecting sleep normalizes.  Works.  SE irritable after Ambien .    Exercise helps. Works from home.  [name].  Past Psychiatric Medication Trials: Lithium ,  lamotrigine  200 mg cognitive side effect, risperidone, fluvoxamine,    Wellbutrin  450 jittery buspirone  SE, Ambien , trazodone  2 prior significant manic episodes  M his hypothyroidism   .Review of Systems:  Review of Systems  Gastrointestinal:  Negative for diarrhea and nausea.  Neurological:  Negative for dizziness,  tremors and weakness.  Psychiatric/Behavioral:  Negative for agitation, behavioral problems, confusion, decreased concentration, dysphoric mood, hallucinations, self-injury, sleep disturbance and suicidal ideas. The patient is not nervous/anxious and is not hyperactive.     Medications: I have reviewed the patient's current medications.  Current Outpatient Medications  Medication Sig Dispense Refill   Acetylcysteine 600 MG CAPS Take 1,200 mg by mouth daily.     b complex vitamins tablet Take 1 tablet by mouth daily.     buPROPion  (WELLBUTRIN  XL) 300 MG 24 hr tablet Take 1 tablet (300 mg total) by mouth daily. 90 tablet 1   cholecalciferol (VITAMIN D3) 25 MCG (1000 UT) tablet Take 1,000 Units by mouth daily.     lamoTRIgine  (LAMICTAL ) 200 MG tablet TAKE 1 TABLET DAILY 90 tablet 0   lithium  carbonate 300 MG capsule TAKE 3 CAPSULES (900 MG TOTAL) BY MOUTH DAILY. 90 capsule 0   magnesium 30 MG tablet Take 30 mg by mouth daily.     Omega-3 Fatty Acids (FISH OIL) 1000 MG CAPS Take by mouth.     Probiotic Product (PROBIOTIC-10 PO) Take by mouth.     zolpidem  (AMBIEN ) 10 MG tablet Take 1/2-1 tablet QHS PRN insomnia 30 tablet 0   No current facility-administered medications for this visit.    Medication Side Effects: dry mouth.  No tremor nor diarrhea.  ? Mild dulling or lethargy  Allergies: No Known Allergies  No past medical history on file.  No family history on  file.  Social History   Socioeconomic History   Marital status: Married    Spouse name: Not on file   Number of children: Not on file   Years of education: Not on file   Highest education level: Not on file  Occupational History   Not on file  Tobacco Use   Smoking status: Never   Smokeless tobacco: Never  Substance and Sexual Activity   Alcohol use: Not on file   Drug use: Not on file   Sexual activity: Not on file  Other Topics Concern   Not on file  Social History Narrative   Not on file   Social Drivers of Health   Financial Resource Strain: Not on file  Food Insecurity: Not on file  Transportation Needs: Not on file  Physical Activity: Not on file  Stress: Not on file  Social Connections: Not on file  Intimate Partner Violence: Not on file    Past Medical History, Surgical history, Social history, and Family history were reviewed and updated as appropriate.   Please see review of systems for further details on the patient's review from today.   Objective:   Physical Exam:  There were no vitals taken for this visit.  Physical Exam Constitutional:      General: He is not in acute distress. Musculoskeletal:        General: No deformity.  Neurological:     Mental Status: He is alert and oriented to person, place, and time.     Coordination: Coordination normal.     Gait: Gait normal.  Psychiatric:        Attention and Perception: He is attentive.        Mood and Affect: Mood is not anxious or depressed. Affect is not labile, blunt or inappropriate.        Speech: Speech normal. Speech is not rapid and pressured or slurred.        Behavior: Behavior normal. Behavior is not slowed.  Thought Content: Thought content normal. Thought content is not delusional. Thought content does not include homicidal or suicidal ideation. Thought content does not include suicidal plan.        Cognition and Memory: Cognition normal.        Judgment: Judgment normal.      Comments: Insight intact. No auditory or visual hallucinations. No delusions. .      Lab Review:     Component Value Date/Time   NA 138 05/26/2020 0820   K 4.1 05/26/2020 0820   CL 103 05/26/2020 0820   CO2 28 05/26/2020 0820   GLUCOSE 111 05/26/2020 0820   BUN 19 05/26/2020 0820   CREATININE 1.15 05/26/2020 0820   CALCIUM 9.5 05/26/2020 0820    No results found for: "WBC", "RBC", "HGB", "HCT", "PLT", "MCV", "MCH", "MCHC", "RDW", "LYMPHSABS", "MONOABS", "EOSABS", "BASOSABS"  Lithium  Lvl  Date Value Ref Range Status  03/24/2022 0.6 0.6 - 1.2 mmol/L Final     No results found for: "PHENYTOIN", "PHENOBARB", "VALPROATE", "CBMZ"   Dr. Bernetta Brilliant 08/03/2021 lithium  level 0.8 creatinine 1.1 calcium 8.4, TSH 3.6 09/2022 Lithium  0.6.     Aaron Aasres Assessment: Plan:    There are no diagnoses linked to this encounter.  30-minute face to face time with patient was spent on counseling and coordination of care. We discussed the following: He is not having any mood swings.   He feels his mood is better on lamotrigine  200 mg daily and there are no significant cognitive side effects at this dose. Further boost in mood andd well-being after adding Wellbutrin  450 AM so far.  Had gotten tolerant of 300 mg daily.  Disc sleep hygiene and usig light blocking masks.  Sleep masks disc previously.  Continue lithium  to 900 mg daily. Continue lamotrigine  200 mg daily. He knows higher doses of lamotrigine  can cause mild cognitive side effects   Counseled patient regarding potential benefits, risks, and side effects of lithium  to include potential risk of lithium  affecting thyroid and renal function.  Discussed need for periodic lab monitoring to determine drug level and to assess for potential adverse effects.  Counseled patient regarding signs and symptoms of lithium  toxicity and advised that they notify office immediately or seek urgent medical attention if experiencing these signs and symptoms.  Patient  advised to contact office with any questions or concerns.   Disc example of lithium  toxicity and dehydration and DDI. Reviewed this again with signs sx toxicity  Disc borderline TSH from PCP.   Extensive discussion.  Much better with levothyroxine.  continue the NAC.  He is cognitively improved with the NAC.  Disc alt Ambien  bc irritable after it but he's gonnna try half dose.  Likes it bc it works.  reduced Wellbutrin  incr to 300 AM DT jitteriness Other pramipexole or Vraylar, etc  option [place] Option retry buspirone  vs BZ discussed. For residual anxiety.  He'd like to try buspirone  again.  Disc how to manage dosing and prior nausea problems with buspirone .  This appt was 30 mins.  FU 6 mos  Nori Beat, MD, DFAPA  Please see After Visit Summary for patient specific instructions.  No future appointments.   No orders of the defined types were placed in this encounter.      -------------------------------

## 2024-05-19 ENCOUNTER — Other Ambulatory Visit: Payer: Self-pay | Admitting: Psychiatry

## 2024-05-19 DIAGNOSIS — F319 Bipolar disorder, unspecified: Secondary | ICD-10-CM

## 2024-07-30 ENCOUNTER — Ambulatory Visit (INDEPENDENT_AMBULATORY_CARE_PROVIDER_SITE_OTHER): Admitting: Psychiatry

## 2024-07-30 ENCOUNTER — Encounter: Payer: Self-pay | Admitting: Psychiatry

## 2024-07-30 DIAGNOSIS — Z79899 Other long term (current) drug therapy: Secondary | ICD-10-CM | POA: Diagnosis not present

## 2024-07-30 DIAGNOSIS — F5105 Insomnia due to other mental disorder: Secondary | ICD-10-CM

## 2024-07-30 DIAGNOSIS — F319 Bipolar disorder, unspecified: Secondary | ICD-10-CM

## 2024-07-30 DIAGNOSIS — F411 Generalized anxiety disorder: Secondary | ICD-10-CM

## 2024-07-30 MED ORDER — LITHIUM CARBONATE 300 MG PO CAPS: 900.0000 mg | ORAL_CAPSULE | Freq: Every day | ORAL | 1 refills | Status: AC

## 2024-07-30 MED ORDER — BUPROPION HCL ER (XL) 300 MG PO TB24: 300.0000 mg | ORAL_TABLET | Freq: Every day | ORAL | 1 refills | Status: AC

## 2024-07-30 MED ORDER — LAMOTRIGINE 200 MG PO TABS: 200.0000 mg | ORAL_TABLET | Freq: Every day | ORAL | 1 refills | Status: AC

## 2024-07-30 MED ORDER — ZOLPIDEM TARTRATE 10 MG PO TABS: ORAL_TABLET | ORAL | 3 refills | Status: AC

## 2024-07-30 NOTE — Progress Notes (Signed)
 Julian [name] [id number] 09/09/1971 53 y.o.  Subjective:   Patient ID:  Julian [name] is a 53 y.o. (DOB 04/30/1971) male.  Chief Complaint:  Chief Complaint  Patient presents with   Follow-up    HPI Julian [name] presents to the office today for follow-up of adding lithium  and reducing lamotrigine .  At the visit in June we increased lithium  to 750 mg daily because of a low lithium  level and started tapering lamotrigine  in hopes that he could be a lithium  mono responder.  He was also scheduled to get a lithium  level.  At the visit May 16, 2019 the following change was made: Last lithium  level was 0.5 which is still at the low end of the normal range. Due to low lithium  level rec increase into the normal range by increasing lithium  900 mg daily . After 2 weeks of 50 mg lamotrigine  Then stop lamotrigine .  At visit November started buspirone . Weaning off lamotrigine  was hard.  Felt bad, tingling fingers, etc.  Lasted a couple of weeks.  November 24 called and wanted to restart lamotrigine  at 75 mg today.  Noticed cognitive SE at higher doses.  [name] noticed the benefit too, more positive and relaxed.   seen December 2020.  He stated the following and no meds were changed.  I feel like this is the right combination.  Even noticed restarting 25 mg of lamotrigine  he saw benefit. Better cognition with less also of the lamotrigine .  I like the stability I have right now.   As of December 31, 2019:  Seen with [name] 12/03/19 called and wanted to increase lamotrigine  to 100 bc mixed anxiety and depression.  He thinks the right amount is 150 mg daily.  Saw benefit substantially with the increase.  Still not quite where he wants to be.  Stressful bc changing jobs to new company.  Anxiety and depression is still abnormal.   W noted SOB, poor sleep and a lot of anxiety.  Still a little depressed and irritable. No sig cognitive problems. Plan:  He is not having any mood swings.  However he  is more anxious and a little depressed with low dose of lamotrigine .  However he is cognitively sharper with low-dosel lamotrigine .  Option increase lamotrigine  vs lithium .  Disc Se concerns.  Disc pros cons.  Increase lithium  to 1200 mg daily. If this fails to improve his residual symptoms of depression and anxiety then we will reduced lithium  back to 900 mg daily and increase lamotrigine  to either 125 or 150 mg daily. He knows at higher doses of lamotrigine  he does have some mild cognitive side effects of it therefore increasing the lithium  may prevent that side effect and resolve his symptoms.  January 29, 2020 appointment the following is noted: Took lithium  1200 mg for 9 days bc it didn't helpt the irritability enough or depression.  So increased lamotrigine  to 150 mg daily and dropped lithium  to 900 mg for the last few weeks.  Been really good since.   This is the best I've felt in a long time.  Tolerated cognitively well.   Still believes the lithium  is a wonder drug.  Very stable.  Patient reports stable mood, and less depressed or irritable moods.  Without lamotrigine  he has more anxiety and irritability.  Still is more intense and harder to relax than it used to be but mild.  Positive stress of new job and gets up early to go work out.  Light sleeper.  Patient  denies difficulty with sleep initiation or maintenance, but works hard to get enough sleep.  Usually taking Ambien  lately and not trazodone.  Sleep fine with it.  Denies appetite disturbance.  Patient reports that energy and motivation have been good.  Patient denies any difficulty with concentration.  Patient denies any suicidal ideation. Lately working hard and more tired the last few days. Option increase lamotrigine  vs lithium .  Disc Se concerns.  Disc pros cons.  Continue lithium  to 900 mg daily. Continue lamotrigine  150 mg daily.  04/29/20 appt with the following noted: Taking NAC longterm. Really pleased where I am cognitively,  mood and anxiety. Pleased with doses and SE. Patient reports stable mood and denies depressed or irritable moods.  Patient denies any recent difficulty with anxiety.  Patient denies difficulty with sleep initiation or maintenance but chronic worry over sleep. Ambien  rarely and trazodone more rare.  Denies appetite disturbance.  Patient reports that energy and motivation have been good.  Patient denies any difficulty with concentration.  Patient denies any suicidal ideation. Recognizes at 200 lamotrigine  had sig cog problems.  Wonders about lithium  delaying response time. Plan no changes  10/29/20 appt noted: Still doing well.  Wants to have more intensity and engagement.  But happy and content and productive.  Lost a little edge for pushing but it's not enough to make a change.  No concerns from wife.   Sleep great with melatonin 5 mg.  Ambien  2 monthly.  Not a big fan of trazodone. Patient reports stable mood and denies depressed or irritable moods.  Patient denies any recent difficulty with anxiety.  Patient denies difficulty with sleep initiation or maintenance. Denies appetite disturbance.  Patient reports that energy and motivation have been good.  Patient denies any difficulty with concentration.  Patient denies any suicidal ideation. Plan: No med change  12/10/2020 phone call patient wanted to increase lamotrigine  from 150 to 175 mg daily.  Okay. 02/01/2021 phone call wanting to increase lamotrigine  from 175 mg to 200 mg daily.  Agreed.  03/30/2021 appointment with the following noted: Great now.  Feels he's better witn the increase lamotrigine  to 200 mg daily.  Needs lithium  and lamotrigine . No cognitive problems with the lamotrigine  now.  Disproved by changing doses.  Satisfied. Patient reports stable mood and denies depressed or irritable moods.  Patient denies any recent difficulty with anxiety.  Patient denies difficulty with sleep initiation or maintenance. Denies appetite disturbance.   Patient reports that energy and motivation have been good.  Patient denies any difficulty with concentration.  Patient denies any suicidal ideation. Tolerating lithium  well. Dentist checked him for OSA-  trying retainer despite scoring in the mild range of OSA. New doc Dr. Georgette Gentry [facility] Plan continue lithium  900 and lamotrigine  200 mg daily and get lithium  labs  09/21/2021 appt noted: Overall pretty well.  Over last 5 years or so, increased anxiety.  It is intermittently difficult to deal with.  Persistent nagging anxiety for a period of time.  Can interfere with problem solving and seeing the big picture. Got mouthpiece for OSA and sleep quality is much better with better alertness.   Patient reports stable mood and denies depressed or irritable moods.   Patient denies difficulty with sleep initiation or maintenance. Denies appetite disturbance.  Patient reports that energy and motivation have been good.  Patient denies any difficulty with concentration.  Patient denies any suicidal ideation. Cycles of anxiety a week out of the month. Plan no changes  03/22/22 appt noted: Good and very steady.  No swings. Patient reports stable mood and denies depressed or irritable moods.  Patient denies any recent difficulty with anxiety.  Patient denies difficulty with sleep initiation or maintenance. Denies appetite disturbance.  Patient reports that energy and motivation have been good.  Patient denies any difficulty with concentration.  Patient denies any suicidal ideation. Sleep is pretty good. Not as good in winter  09/20/22 appt noted: TSH 4.39.  [facility]  Dr. Loreli.  Annual check up.   Notices cold intolerance.  Gained a little weight.   Wonders if lithium  is the cause.  Mood is really good.  Years past sometimes holidays will cause mood instability and now just as steady as he could be.  Maybe a little unmotivated and lethargic at times.  Pretty good at work.  Good annual  review.   Plan: Continue lithium  to 900 mg daily. Continue lamotrigine  200 mg daily.  03/22/23 appt noted: Late winter to May wwas a bit dep and lacking motivation to do things other than exercises/sports he likes.  Wife notices.  Ex Spring Break to [facility] and didn't enjoy it. No strong seasonal pattern noted except more active and outside in Spring and summer.   Shorter with  kids than 2 yr ago, irritable. Plan: Start Wellbutrin  incr to 300 AM  05/12/23 TC:  Patient reporting that bupropion  was fantastic for about 3 weeks but there is no longer any oomph to it. He is taking 300 mg, said you told him an increase was possible. He has FU 8/20, but would like to increase dose.     MD resp: ok increase Wellbutrin  XL to 450AM  05/23/23 appt noted: Meds: Wellbutrin  XL 450 AM, lamotrigine  200 mg daily, lithium  900 mg daily, Ambien  10 prn rarely. Noticed benefit right away with Wellbutrin  and then seemed to lose some benefit but regained it with increase in Wellbutrin .  This is better state than where I was.   Feels good about the current dose.  Sleep is good generally. No SE with it. Always irritable next day after Ambien  whether 5-10 mg HS.  But it does work for initial insomnia.  08/24/23 appt noted: Meds: Wellbutrin  XL 300 AM, lamotrigine  200 mg daily, lithium  900 mg daily, Ambien  10 prn rarely. Reduced Wellbutrin  to 300 bc jittery and anxious at 450 AM right after here.  Overall postive mood but still not as much motivation and energy as he'd like.  Not a big difference at 450 mg daily.  Overall satisfied with meds.   Not depressed.  No concerns from family nor coworkers.   Sleep pretty good usually.  Ambien  helps when needed.    01/30/24 appt noted: Med: Meds: Wellbutrin  XL 300 AM, lamotrigine  200 mg daily, lithium  900 mg daily, Ambien  10 prn rarely. Still low thyroid until lately.  Levothyroxione helped 30% when added.   No sig dep.  Minimal cycling.  Better with meds.   Ambien  for a  couple weeks.  Great time in [place].  Work stressful .  Affecting sleep normalizes.  Works.  SE irritable after Ambien .  Plan no changes  07/30/24 appt noted:  Med   Wellbutrin  XL 300 AM, lamotrigine  200 mg daily, lithium  900 mg daily, Ambien  10 prn rarely. No SE. Pretty good.  Pretty stable mood and anxiety.  No sig cycling.   Sleep is mostly good and rare Ambien .  5 mg SL works.   Health is good.   Lost  15 #/6 mos.  Intermittent fasting.     Exercise helps. Works from home.  Wife [name].  Past Psychiatric Medication Trials: Lithium ,  lamotrigine  200 mg cognitive side effect, risperidone, fluvoxamine,    Wellbutrin  450 jittery buspirone  SE, Ambien , trazodone  2 prior significant manic episodes  M his hypothyroidism   .Review of Systems:  Review of Systems  Cardiovascular:  Negative for palpitations.  Gastrointestinal:  Negative for diarrhea and nausea.  Neurological:  Negative for dizziness, tremors and weakness.  Psychiatric/Behavioral:  Negative for agitation, behavioral problems, confusion, decreased concentration, dysphoric mood, hallucinations, self-injury, sleep disturbance and suicidal ideas. The patient is not nervous/anxious and is not hyperactive.     Medications: I have reviewed the patient's current medications.  Current Outpatient Medications  Medication Sig Dispense Refill   Acetylcysteine 600 MG CAPS Take 1,200 mg by mouth daily.     b complex vitamins tablet Take 1 tablet by mouth daily.     cholecalciferol (VITAMIN D3) 25 MCG (1000 UT) tablet Take 1,000 Units by mouth daily.     magnesium 30 MG tablet Take 30 mg by mouth daily.     Omega-3 Fatty Acids (FISH OIL) 1000 MG CAPS Take by mouth.     Probiotic Product (PROBIOTIC-10 PO) Take by mouth.     SYNTHROID 75 MCG tablet Take 75 mcg by mouth daily before breakfast.     buPROPion  (WELLBUTRIN  XL) 300 MG 24 hr tablet Take 1 tablet (300 mg total) by mouth daily. 90 tablet 1   lamoTRIgine  (LAMICTAL ) 200 MG  tablet Take 1 tablet (200 mg total) by mouth daily. 90 tablet 1   lithium  carbonate 300 MG capsule Take 3 capsules (900 mg total) by mouth daily. 270 capsule 1   zolpidem  (AMBIEN ) 10 MG tablet Take 1/2-1 tablet QHS PRN insomnia 30 tablet 3   No current facility-administered medications for this visit.    Medication Side Effects: dry mouth.  No tremor nor diarrhea.  ? Mild dulling or lethargy  Allergies: No Known Allergies  History reviewed. No pertinent past medical history.  History reviewed. No pertinent family history.  Social History   Socioeconomic History   Marital status: Married    Spouse name: Not on file   Number of children: Not on file   Years of education: Not on file   Highest education level: Not on file  Occupational History   Not on file  Tobacco Use   Smoking status: Never   Smokeless tobacco: Never  Substance and Sexual Activity   Alcohol use: Not on file   Drug use: Not on file   Sexual activity: Not on file  Other Topics Concern   Not on file  Social History Narrative   Not on file   Social Drivers of Health   Financial Resource Strain: Not on file  Food Insecurity: Not on file  Transportation Needs: Not on file  Physical Activity: Not on file  Stress: Not on file  Social Connections: Not on file  Intimate Partner Violence: Not on file    Past Medical History, Surgical history, Social history, and Family history were reviewed and updated as appropriate.   Please see review of systems for further details on the patient's review from today.   Objective:   Physical Exam:  There were no vitals taken for this visit.  Physical Exam Constitutional:      General: He is not in acute distress. Musculoskeletal:        General: No deformity.  Neurological:     Mental Status: He is alert and oriented to person, place, and time.     Coordination: Coordination normal.     Gait: Gait normal.  Psychiatric:        Attention and Perception: He is  attentive.        Mood and Affect: Mood is not anxious or depressed. Affect is not blunt or inappropriate.        Speech: Speech normal. Speech is not rapid and pressured or slurred.        Behavior: Behavior normal. Behavior is not slowed.        Thought Content: Thought content normal. Thought content is not delusional. Thought content does not include homicidal or suicidal ideation. Thought content does not include suicidal plan.        Cognition and Memory: Cognition normal.        Judgment: Judgment normal.     Comments: Insight intact. No auditory or visual hallucinations. No delusions. .      Lab Review:     Component Value Date/Time   NA 138 05/26/2020 0820   K 4.1 05/26/2020 0820   CL 103 05/26/2020 0820   CO2 28 05/26/2020 0820   GLUCOSE 111 05/26/2020 0820   BUN 19 05/26/2020 0820   CREATININE 1.15 05/26/2020 0820   CALCIUM 9.5 05/26/2020 0820    No results found for: WBC, RBC, HGB, HCT, PLT, MCV, MCH, MCHC, RDW, LYMPHSABS, MONOABS, EOSABS, BASOSABS  Lithium  Lvl  Date Value Ref Range Status  03/24/2022 0.6 0.6 - 1.2 mmol/L Final     No results found for: PHENYTOIN, PHENOBARB, VALPROATE, CBMZ   Dr. Loreli 08/03/2021 lithium  level 0.8 creatinine 1.1 calcium 8.4, TSH 3.6 09/2022 Lithium  0.6.     SABRAres Assessment: Plan:    Abas was seen today for follow-up.  Diagnoses and all orders for this visit:  Bipolar I disorder (HCC) -     Lithium  level -     zolpidem  (AMBIEN ) 10 MG tablet; Take 1/2-1 tablet QHS PRN insomnia -     lithium  carbonate 300 MG capsule; Take 3 capsules (900 mg total) by mouth daily. -     lamoTRIgine  (LAMICTAL ) 200 MG tablet; Take 1 tablet (200 mg total) by mouth daily. -     buPROPion  (WELLBUTRIN  XL) 300 MG 24 hr tablet; Take 1 tablet (300 mg total) by mouth daily.  Generalized anxiety disorder  Insomnia due to mental condition  Lithium  use    30-minute face to face time with patient was spent on  counseling and coordination of care. We discussed the following: He is not having any mood swings.   He feels his mood is better on lamotrigine  200 mg daily and there are no significant cognitive side effects at this dose. Further boost in mood andd well-being after adding Wellbutrin  450 AM so far.  Had gotten tolerant of 300 mg daily.  Disc sleep hygiene and usig light blocking masks.  Sleep masks disc previously.  Continue lithium  to 900 mg daily. Continue lamotrigine  200 mg daily. He knows higher doses of lamotrigine  can cause mild cognitive side effects   Counseled patient regarding potential benefits, risks, and side effects of lithium  to include potential risk of lithium  affecting thyroid and renal function.  Discussed need for periodic lab monitoring to determine drug level and to assess for potential adverse effects.  Counseled patient regarding signs and symptoms of lithium  toxicity and advised that they notify office immediately or seek urgent medical  attention if experiencing these signs and symptoms.  Patient advised to contact office with any questions or concerns.   Disc example of lithium  toxicity and dehydration and DDI. Reviewed this again with signs sx toxicity  continue the NAC.  He is cognitively improved with the NAC.  Disc alt Ambien  bc irritable after it but he's gonnna try half dose.  Likes it bc it works.  reduced Wellbutrin  incr to 300 AM DT jitteriness Other pramipexole or Vraylar, etc  option [place] Option retry buspirone  vs BZ discussed. For residual anxiety.  He'd like to try buspirone  again.  Disc how to manage dosing and prior nausea problems with buspirone .  Check lithium  level.  This appt was 30 mins.  FU 6 mos  Lorene Macintosh, MD, DFAPA  Please see After Visit Summary for patient specific instructions.  Future Appointments  Date Time Provider Department Center  08/14/2024  9:00 AM Szer, [place], DO [facility] None     Orders Placed This Encounter   Procedures   Lithium  level       -------------------------------

## 2024-08-08 LAB — LITHIUM LEVEL: Lithium Lvl: 0.7 mmol/L (ref 0.6–1.2)

## 2024-08-13 NOTE — Progress Notes (Signed)
 Office Visit Note  Patient: Julian [name]             Date of Birth: 29-Jul-1971           MRN: [id number]             PCP: Loreli Elsie JONETTA Mickey., MD Referring: No ref. provider found Visit Date: 08/14/2024 Occupation: @GUAROCC @  Subjective:  New Patient (Initial Visit) (Pain in bilateral knees. Pain increased and interferes with activities. Patient has tried PT. )  Discussed the use of AI scribe software for clinical note transcription with the patient, who gave verbal consent to proceed.  History of Present Illness Julian [name] is a 53 year old male who presents with worsening bilateral knee pain and stiffness. He was referred by a physical therapist for re-evaluation of knee pain.  He has chronic bilateral knee pain, with the left knee being significantly more painful. The pain has progressively worsened over the past five years, with severe episodes following minor activities such as playing pickleball or missing a step. The pain is described as a constant ache, rated at 5 out of 10, with occasional severe flares. It is accompanied by stiffness throughout the day and occasional swelling, though not visibly apparent. He has a history of knee reconstruction surgery on the left knee following a basketball injury in 2003, where he tore his ACL, MCL, PCL, and fractured the femoral condyle. He also had surgery on the right knee for a meniscus tear approximately four years ago.  He manages the pain with Advil, taking 800 mg as needed, which provides some relief after several days. He has received cortisone injections, which provided relief for about three weeks, and hyaluronic acid injections every six months, lasting about four months but not very effective.  He experiences low back stiffness, particularly in the mornings, which improves as the day progresses unless he engages in activities that strain his back. He is cautious with lifting and bending to avoid exacerbating the pain. No imaging  studies of his back have been performed.  His fingers turn white in the cold, but there is no associated purple discoloration. No history of psoriasis, ulcerative colitis, Crohn's disease, or other autoimmune conditions. His family history includes a grandfather who had a colostomy bag, possibly due to ulcerative colitis.  He had an elevated CK level approximately ten years ago, attributed to a strenuous workout. A muscle biopsy was performed, and no concerning findings were reported.     Activities of Daily Living:  Patient reports morning stiffness for 2-3 hours.   Patient Denies nocturnal pain.  Difficulty dressing/grooming: Denies Difficulty climbing stairs: Reports Difficulty getting out of chair: Reports Difficulty using hands for taps, buttons, cutlery, and/or writing: Denies  Review of Systems  Constitutional:  Negative for fatigue.  HENT:  Positive for mouth dryness. Negative for mouth sores.   Eyes:  Negative for dryness.  Respiratory:  Negative for shortness of breath.   Cardiovascular:  Negative for chest pain and palpitations.  Gastrointestinal:  Negative for blood in stool, constipation and diarrhea.  Endocrine: Negative for increased urination.  Genitourinary:  Negative for involuntary urination.  Musculoskeletal:  Positive for joint pain, gait problem, joint pain, joint swelling, muscle weakness and morning stiffness. Negative for myalgias, muscle tenderness and myalgias.  Skin:  Positive for color change. Negative for rash, hair loss and sensitivity to sunlight.  Allergic/Immunologic: Negative for susceptible to infections.  Neurological:  Negative for dizziness and headaches.  Hematological:  Negative for swollen  glands.  Psychiatric/Behavioral:  Positive for sleep disturbance. Negative for depressed mood. The patient is not nervous/anxious.     PMFS History:  Patient Active Problem List   Diagnosis Date Noted   Right lateral epicondylitis 09/21/2015    Past  Medical History:  Diagnosis Date   Bipolar affective (HCC)    Hypothyroidism     Family History  Problem Relation Age of Onset   Cancer Mother        Kidney   Hypothyroidism Mother    Uterine cancer Mother    Alzheimer's disease Maternal Grandmother    Heart attack Maternal Grandfather    Cancer Paternal Grandmother        Breast   Alzheimer's disease Paternal Grandmother    Cerebral palsy Son    Past Surgical History:  Procedure Laterality Date   APPENDECTOMY     KNEE RECONSTRUCTION Left 2003   TYMPANOSTOMY TUBE PLACEMENT     VARICOCELE EXCISION     WISDOM TOOTH EXTRACTION     Social History   Social History Narrative   Not on file   Immunization History  Administered Date(s) Administered   Moderna Sars-Covid-2 Vaccination 12/16/2019     Objective: Vital Signs: BP 123/74 (BP Location: Right Arm, Patient Position: Sitting, Cuff Size: Small)   Pulse 67   Temp 98 F (36.7 C)   Resp 12   Ht 6' 3 (1.905 m)   Wt 195 lb 6.4 oz (88.6 kg)   BMI 24.42 kg/m    Physical Exam Vitals and nursing note reviewed.  HENT:     Head: Normocephalic and atraumatic.     Nose: Nose normal.  Eyes:     Conjunctiva/sclera: Conjunctivae normal.     Pupils: Pupils are equal, round, and reactive to light.  Cardiovascular:     Rate and Rhythm: Normal rate and regular rhythm.     Heart sounds: Normal heart sounds.  Pulmonary:     Effort: Pulmonary effort is normal.     Breath sounds: Normal breath sounds.  Skin:    General: Skin is warm and dry.  Neurological:     Mental Status: He is alert. Mental status is at baseline.  Psychiatric:        Mood and Affect: Mood normal.        Behavior: Behavior normal.      Musculoskeletal Exam:   CDAI Exam: CDAI Score: -- Patient Global: --; Provider Global: -- Swollen: --; Tender: -- Joint Exam 08/14/2024   No joint exam has been documented for this visit   There is currently no information documented on the homunculus. Go to the  Rheumatology activity and complete the homunculus joint exam.  Investigation: No additional findings.  Imaging: DG Lumbar Spine 2-3 Views Result Date: 08/14/2024 EXAM: 2 or 3 VIEW(S) XRAY OF THE LUMBAR SPINE 08/14/2024 03:36:49 PM COMPARISON: None available. CLINICAL HISTORY: low back pain FINDINGS: LUMBAR SPINE: BONES: No acute fracture. No aggressive appearing osseous lesion. Minimal retrolisthesis of L3-L4 is noted secondary to moderate degenerative disc disease at this level. DISCS AND DEGENERATIVE CHANGES: Mild degenerative disc disease is noted at L2-L3. Moderate degenerative disc disease is noted at L3-L4. Mild degenerative disc disease is noted at L4-L5. SOFT TISSUES: No acute abnormality. IMPRESSION: 1. No acute osseous abnormality of the lumbar spine. 2. Moderate degenerative disc disease at L3-4 with minimal retrolisthesis at this level. 3. Mild degenerative disc disease at L2-3 and L4-5. Electronically signed by: Lynwood Seip MD 08/14/2024 05:12 PM EST RP Workstation:  [id number]    Recent Labs: Lab Results  Component Value Date   NA 138 05/26/2020   K 4.1 05/26/2020   CL 103 05/26/2020   CO2 28 05/26/2020   GLUCOSE 111 05/26/2020   BUN 19 05/26/2020   CREATININE 1.15 05/26/2020   CALCIUM 9.5 05/26/2020   Lab Results  Component Value Date   RF <10 08/14/2024    Speciality Comments: No specialty comments available.  Procedures:  No procedures performed Allergies: Patient has no known allergies.   Assessment / Plan:     Visit Diagnoses: Chronic pain of both knees - Plan: Rheumatoid Factor, Cyclic citrul peptide antibody, IgG, Sedimentation rate, C-reactive protein  Low back pain, unspecified back pain laterality, unspecified chronicity, unspecified whether sciatica present - Plan: DG Lumbar Spine 2-3 Views  Assessment and Plan Assessment & Plan Chronic bilateral knee pain, left greater than right, status post reconstruction and meniscus surgery Chronic bilateral knee  pain with significant degeneration and spurs on x-ray. Managed with Advil and hyaluronic acid injections. At this time, there is no concern for an inflammatory arthritis in his knees that would be the source of his symptoms. 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 Given referral was to rule out RA, will obtain RF, CCP, ESR, and CRP today. However, discussed with patient that b/l knees would be a very atypical presentation of RA, especially in a patient with known OA as the source of his symptoms. Patient verbalizes understanding.   Chronic low back pain with morning stiffness Chronic low back pain with morning stiffness, improving throughout the day unless exacerbated by activity. Although not patient's main concern, there could possible be an inflammatory component given his presentation. Will check inflammatory markers as discussed above, and will obtain XR lumbar for evaluation.    Orders: Orders Placed This Encounter  Procedures   DG Lumbar Spine 2-3 Views   Rheumatoid Factor   Cyclic citrul peptide antibody, IgG   Sedimentation rate   C-reactive protein   No orders of the defined types were placed in this encounter.   I personally spent a total of 45 minutes in the care of the patient today including preparing to see the patient, getting/reviewing separately obtained history, performing a medically appropriate exam/evaluation, counseling and educating, placing orders, and documenting clinical information in the EHR.  Follow-Up Instructions: Return if symptoms worsen or fail to improve.   Asberry Claw, DO

## 2024-08-14 ENCOUNTER — Ambulatory Visit

## 2024-08-14 ENCOUNTER — Ambulatory Visit
Admission: RE | Admit: 2024-08-14 | Discharge: 2024-08-14 | Disposition: A | Discharge status: Home / Self Care | Source: Ambulatory Visit

## 2024-08-14 VITALS — BP 123/74 | HR 67 | Temp 98.0°F | Resp 12 | Ht 75.0 in | Wt 195.4 lb

## 2024-08-14 DIAGNOSIS — M25562 Pain in left knee: Secondary | ICD-10-CM | POA: Diagnosis not present

## 2024-08-14 DIAGNOSIS — M25561 Pain in right knee: Secondary | ICD-10-CM

## 2024-08-14 DIAGNOSIS — M545 Low back pain, unspecified: Secondary | ICD-10-CM

## 2024-08-14 DIAGNOSIS — G8929 Other chronic pain: Secondary | ICD-10-CM

## 2024-08-14 NOTE — Patient Instructions (Signed)
 Sulindac - safest anti-inflammatory with Lithium  use.

## 2024-08-15 LAB — SEDIMENTATION RATE: Sed Rate: 6 mm/h (ref 0–20)

## 2024-08-15 LAB — C-REACTIVE PROTEIN: CRP: <3 mg/L (ref <8.0)

## 2024-08-15 LAB — RHEUMATOID FACTOR: Rheumatoid fact SerPl-aCnc: <10 [IU]/mL (ref <14)

## 2024-08-15 LAB — CYCLIC CITRUL PEPTIDE ANTIBODY, IGG: Cyclic Citrullin Peptide Ab: <16 U

## 2024-08-23 ENCOUNTER — Ambulatory Visit: Payer: Self-pay

## 2025-01-28 ENCOUNTER — Ambulatory Visit: Admitting: Psychiatry
